# Patient Record
Sex: Female | Born: 1985 | Race: White | Hispanic: No | Marital: Married | Smoking: Former smoker
Health system: Southern US, Community
[De-identification: ages and names within clinical notes are randomized; demographics above are authoritative.]

## PROBLEM LIST (undated history)

## (undated) DIAGNOSIS — Z789 Other specified health status: Secondary | ICD-10-CM

## (undated) HISTORY — PX: APPENDECTOMY: SHX54

## (undated) HISTORY — PX: DILATION AND CURETTAGE OF UTERUS: SHX78

---

## 2013-02-23 ENCOUNTER — Emergency Department (HOSPITAL_BASED_OUTPATIENT_CLINIC_OR_DEPARTMENT_OTHER)
Admission: EM | Admit: 2013-02-23 | Discharge: 2013-02-24 | Disposition: A | Discharge status: Home / Self Care | Payer: Medicaid Other | Attending: Emergency Medicine | Admitting: Emergency Medicine

## 2013-02-23 ENCOUNTER — Encounter (HOSPITAL_BASED_OUTPATIENT_CLINIC_OR_DEPARTMENT_OTHER): Payer: Self-pay | Admitting: *Deleted

## 2013-02-23 DIAGNOSIS — F17200 Nicotine dependence, unspecified, uncomplicated: Secondary | ICD-10-CM | POA: Insufficient documentation

## 2013-02-23 DIAGNOSIS — Z3202 Encounter for pregnancy test, result negative: Secondary | ICD-10-CM | POA: Insufficient documentation

## 2013-02-23 DIAGNOSIS — R5383 Other fatigue: Secondary | ICD-10-CM | POA: Insufficient documentation

## 2013-02-23 DIAGNOSIS — M722 Plantar fascial fibromatosis: Secondary | ICD-10-CM | POA: Insufficient documentation

## 2013-02-23 DIAGNOSIS — R5381 Other malaise: Secondary | ICD-10-CM | POA: Insufficient documentation

## 2013-02-23 LAB — URINALYSIS, ROUTINE W REFLEX MICROSCOPIC
Glucose, UA: NEGATIVE mg/dL
Ketones, ur: NEGATIVE mg/dL
Leukocytes, UA: NEGATIVE
Specific Gravity, Urine: 1.03 (ref 1.005–1.030)
pH: 6.5 (ref 5.0–8.0)

## 2013-02-23 LAB — PREGNANCY, URINE: Preg Test, Ur: NEGATIVE

## 2013-02-23 NOTE — ED Provider Notes (Signed)
History    This chart was scribed for Garrett Bowring Smitty Cords, MD by Leone Payor, ED Scribe. This patient was seen in room MH01/MH01 and the patient's care was started 11:52 PM.   CSN: [id number]  Arrival date & time 02/23/13  2139   None     Chief Complaint  Patient presents with  . Foot Pain  . Fatigue     Patient is a 27 y.o. female presenting with lower extremity pain. The history is provided by the patient. No language interpreter was used.  Foot Pain This is a new problem. The current episode started more than 1 week ago. The problem occurs constantly. The problem has not changed since onset.Pertinent negatives include no chest pain, no abdominal pain, no headaches and no shortness of breath. Nothing aggravates the symptoms. Nothing relieves the symptoms. She has tried nothing for the symptoms. The treatment provided no relief.    Natalie Gilmore is a 27 y.o. female who presents to the Emergency Department complaining of ongoing, constant, gradually worsening R foot pain starting about 2 months ago. Pt denies any recent injury to the affected area. Pt had a hair splinter about 2 month ago which she was able to remove.   Pt is a current everyday smoker but denies alcohol use.  History reviewed. No pertinent past medical history.  Past Surgical History  Procedure Laterality Date  . Appendectomy    . Dilation and curettage of uterus      History reviewed. No pertinent family history.  History  Substance Use Topics  . Smoking status: Current Every Day Smoker -- 0.50 packs/day    Types: Cigarettes  . Smokeless tobacco: Not on file  . Alcohol Use: No    No OB history provided.   Review of Systems  Constitutional: Negative for fever, chills, appetite change and unexpected weight change.  HENT: Negative for sore throat, neck pain and neck stiffness.   Respiratory: Negative for shortness of breath.   Cardiovascular: Negative for chest pain.  Gastrointestinal: Negative for  abdominal pain.  Musculoskeletal: Positive for arthralgias (foot pain). Negative for myalgias, back pain and joint swelling.  Skin: Negative for rash and wound.  Neurological: Negative for headaches.  All other systems reviewed and are negative.    Allergies  Review of patient's allergies indicates no known allergies.  Home Medications  No current outpatient prescriptions on file.  BP 134/87  Pulse 83  Temp(Src) 98.2 F (36.8 C) (Oral)  Resp 20  Ht 5\' 9"  (1.753 m)  Wt 278 lb (126.1 kg)  BMI 41.03 kg/m2  SpO2 98%  LMP 01/19/2013  Physical Exam  Nursing note and vitals reviewed. Constitutional: She is oriented to person, place, and time. She appears well-developed and well-nourished.  HENT:  Head: Normocephalic and atraumatic.  Mouth/Throat: Oropharynx is clear and moist.  Eyes: EOM are normal. Pupils are equal, round, and reactive to light.  Neck: Normal range of motion. Neck supple. No tracheal deviation present.  Cardiovascular: Normal rate, regular rhythm, normal heart sounds and intact distal pulses.   3+ DP Cap refill < 2 seconds to the toe.  Pulmonary/Chest: Effort normal and breath sounds normal. No respiratory distress.  Abdominal: Soft. Bowel sounds are normal. There is no tenderness. There is no rebound and no guarding.  Musculoskeletal: Normal range of motion.  Right foot FROM and neurovascularly intact pain with palpation of the plantar fascia.    Neurological: She is alert and oriented to person, place, and time. She has normal  reflexes.  Skin: Skin is warm and dry. No rash noted. No erythema.  Psychiatric: She has a normal mood and affect.    ED Course  Procedures (including critical care time)  DIAGNOSTIC STUDIES: Oxygen Saturation is 98% on room air, normal by my interpretation.    COORDINATION OF CARE: 12:28 AM-Discussed treatment plan with pt at bedside and pt agreed to plan.    Labs Reviewed  URINALYSIS, ROUTINE W REFLEX MICROSCOPIC   PREGNANCY, URINE   No results found.   No diagnosis found.    MDM  Given history will treat for plantar fasciitis. Have instructed elevation, post op shoe, NSAIDS and changes from flats while at work to a hard soled shoe to provide better support.  Sports medicine follow up     I personally performed the services described in this documentation, which was scribed in my presence. The recorded information has been reviewed and is accurate.    Jasmine Awe, MD 02/24/13 [phone]

## 2013-02-23 NOTE — ED Notes (Signed)
Pt c/o right foot pain w/o injury also c/o generalized weakness , LMP march 17

## 2013-02-24 ENCOUNTER — Emergency Department (HOSPITAL_BASED_OUTPATIENT_CLINIC_OR_DEPARTMENT_OTHER): Payer: Medicaid Other

## 2013-02-24 MED ORDER — NAPROXEN 375 MG PO TABS: 375.0000 mg | ORAL_TABLET | Freq: Two times a day (BID) | ORAL | Status: DC

## 2013-02-24 MED ORDER — TRAMADOL HCL 50 MG PO TABS
50.0000 mg | ORAL_TABLET | Freq: Once | ORAL | Status: AC
  Administered 2013-02-24: 50 mg via ORAL
  Filled 2013-02-24: qty 1

## 2014-09-12 ENCOUNTER — Emergency Department (HOSPITAL_COMMUNITY): Payer: PRIVATE HEALTH INSURANCE

## 2014-09-12 ENCOUNTER — Emergency Department (HOSPITAL_COMMUNITY)
Admission: EM | Admit: 2014-09-12 | Discharge: 2014-09-12 | Disposition: A | Discharge status: Home / Self Care | Payer: PRIVATE HEALTH INSURANCE | Attending: Emergency Medicine | Admitting: Emergency Medicine

## 2014-09-12 ENCOUNTER — Encounter (HOSPITAL_COMMUNITY): Payer: Self-pay | Admitting: Emergency Medicine

## 2014-09-12 DIAGNOSIS — R1030 Lower abdominal pain, unspecified: Secondary | ICD-10-CM

## 2014-09-12 DIAGNOSIS — A64 Unspecified sexually transmitted disease: Secondary | ICD-10-CM | POA: Insufficient documentation

## 2014-09-12 DIAGNOSIS — R1032 Left lower quadrant pain: Secondary | ICD-10-CM | POA: Insufficient documentation

## 2014-09-12 DIAGNOSIS — M549 Dorsalgia, unspecified: Secondary | ICD-10-CM | POA: Insufficient documentation

## 2014-09-12 DIAGNOSIS — R1013 Epigastric pain: Secondary | ICD-10-CM | POA: Insufficient documentation

## 2014-09-12 DIAGNOSIS — Z720 Tobacco use: Secondary | ICD-10-CM | POA: Insufficient documentation

## 2014-09-12 DIAGNOSIS — R1031 Right lower quadrant pain: Secondary | ICD-10-CM | POA: Insufficient documentation

## 2014-09-12 DIAGNOSIS — Z791 Long term (current) use of non-steroidal anti-inflammatories (NSAID): Secondary | ICD-10-CM | POA: Insufficient documentation

## 2014-09-12 DIAGNOSIS — K5900 Constipation, unspecified: Secondary | ICD-10-CM | POA: Insufficient documentation

## 2014-09-12 DIAGNOSIS — Z9089 Acquired absence of other organs: Secondary | ICD-10-CM | POA: Insufficient documentation

## 2014-09-12 DIAGNOSIS — Z3202 Encounter for pregnancy test, result negative: Secondary | ICD-10-CM | POA: Insufficient documentation

## 2014-09-12 LAB — CBC WITH DIFFERENTIAL/PLATELET
Basophils Absolute: 0 10*3/uL (ref 0.0–0.1)
Basophils Relative: 0 % (ref 0–1)
Eosinophils Absolute: 0.1 10*3/uL (ref 0.0–0.7)
Eosinophils Relative: 2 % (ref 0–5)
HCT: 44 % (ref 36.0–46.0)
Hemoglobin: 14.9 g/dL (ref 12.0–15.0)
Lymphocytes Relative: 32 % (ref 12–46)
Lymphs Abs: 2.6 10*3/uL (ref 0.7–4.0)
MCH: 30.5 pg (ref 26.0–34.0)
MCHC: 33.9 g/dL (ref 30.0–36.0)
MCV: 90.2 fL (ref 78.0–100.0)
Monocytes Absolute: 0.6 10*3/uL (ref 0.1–1.0)
Monocytes Relative: 8 % (ref 3–12)
Neutro Abs: 4.7 10*3/uL (ref 1.7–7.7)
Neutrophils Relative %: 58 % (ref 43–77)
Platelets: 230 10*3/uL (ref 150–400)
RBC: 4.88 MIL/uL (ref 3.87–5.11)
RDW: 12.4 % (ref 11.5–15.5)
WBC: 8.1 10*3/uL (ref 4.0–10.5)

## 2014-09-12 LAB — COMPREHENSIVE METABOLIC PANEL
ALT: 24 U/L (ref 0–35)
AST: 23 U/L (ref 0–37)
Albumin: 3.6 g/dL (ref 3.5–5.2)
Alkaline Phosphatase: 69 U/L (ref 39–117)
Anion gap: 14 (ref 5–15)
BUN: 12 mg/dL (ref 6–23)
CALCIUM: 9.1 mg/dL (ref 8.4–10.5)
CO2: 21 meq/L (ref 19–32)
CREATININE: 0.59 mg/dL (ref 0.50–1.10)
Chloride: 106 mEq/L (ref 96–112)
GFR calc Af Amer: >90 mL/min (ref >90)
Glucose, Bld: 92 mg/dL (ref 70–99)
Potassium: 4.6 mEq/L (ref 3.7–5.3)
Sodium: 141 mEq/L (ref 137–147)
TOTAL PROTEIN: 7.9 g/dL (ref 6.0–8.3)
Total Bilirubin: 0.2 mg/dL — ABNORMAL LOW (ref 0.3–1.2)

## 2014-09-12 LAB — URINALYSIS, ROUTINE W REFLEX MICROSCOPIC
BILIRUBIN URINE: NEGATIVE
Glucose, UA: NEGATIVE mg/dL
Hgb urine dipstick: NEGATIVE
KETONES UR: NEGATIVE mg/dL
Leukocytes, UA: NEGATIVE
NITRITE: NEGATIVE
Protein, ur: NEGATIVE mg/dL
SPECIFIC GRAVITY, URINE: 1.028 (ref 1.005–1.030)
UROBILINOGEN UA: 0.2 mg/dL (ref 0.0–1.0)
pH: 7 (ref 5.0–8.0)

## 2014-09-12 LAB — LIPASE, BLOOD: Lipase: 22 U/L (ref 11–59)

## 2014-09-12 LAB — WET PREP, GENITAL
Clue Cells Wet Prep HPF POC: NONE SEEN
Trich, Wet Prep: NONE SEEN
Yeast Wet Prep HPF POC: NONE SEEN

## 2014-09-12 LAB — POC URINE PREG, ED: Preg Test, Ur: NEGATIVE

## 2014-09-12 MED ORDER — OMEPRAZOLE 20 MG PO CPDR: 20.0000 mg | DELAYED_RELEASE_CAPSULE | Freq: Every day | ORAL | Status: DC

## 2014-09-12 MED ORDER — SUCRALFATE 1 G PO TABS: 1.0000 g | ORAL_TABLET | Freq: Four times a day (QID) | ORAL | Status: DC

## 2014-09-12 MED ORDER — CEFTRIAXONE SODIUM 250 MG IJ SOLR
250.0000 mg | Freq: Once | INTRAMUSCULAR | Status: AC
  Administered 2014-09-12: 250 mg via INTRAMUSCULAR
  Filled 2014-09-12: qty 250

## 2014-09-12 MED ORDER — AZITHROMYCIN 250 MG PO TABS
1000.0000 mg | ORAL_TABLET | Freq: Once | ORAL | Status: AC
  Administered 2014-09-12: 1000 mg via ORAL
  Filled 2014-09-12: qty 4

## 2014-09-12 MED ORDER — IOHEXOL 300 MG/ML  SOLN
100.0000 mL | Freq: Once | INTRAMUSCULAR | Status: AC | PRN
  Administered 2014-09-12: 100 mL via INTRAVENOUS

## 2014-09-12 MED ORDER — PANTOPRAZOLE SODIUM 40 MG PO TBEC
40.0000 mg | DELAYED_RELEASE_TABLET | Freq: Once | ORAL | Status: AC
  Administered 2014-09-12: 40 mg via ORAL
  Filled 2014-09-12: qty 1

## 2014-09-12 MED ORDER — STERILE WATER FOR INJECTION IJ SOLN
INTRAMUSCULAR | Status: AC
  Administered 2014-09-12: 10 mL
  Filled 2014-09-12: qty 10

## 2014-09-12 MED ORDER — IOHEXOL 300 MG/ML  SOLN
50.0000 mL | Freq: Once | INTRAMUSCULAR | Status: AC | PRN
  Administered 2014-09-12: 50 mL via ORAL

## 2014-09-12 MED ORDER — GI COCKTAIL ~~LOC~~
30.0000 mL | Freq: Once | ORAL | Status: AC
  Administered 2014-09-12: 30 mL via ORAL
  Filled 2014-09-12: qty 30

## 2014-09-12 NOTE — ED Provider Notes (Signed)
CSN: [id number]     Arrival date & time 09/12/14  1424 History   First MD Initiated Contact with Patient 09/12/14 1459     Chief Complaint  Patient presents with  . Abdominal Pain  . Flank Pain  . Back Pain     (Consider location/radiation/quality/duration/timing/severity/associated sxs/prior Treatment) Patient is a 28 y.o. female presenting with abdominal pain, flank pain, and back pain. The history is provided by the patient.  Abdominal Pain Pain location:  Epigastric, suprapubic, RLQ and LLQ Pain quality: aching (lower abdomen) and sharp (and tightness in upper abdomen)   Pain radiates to:  Back Pain severity:  Moderate Onset quality:  Gradual Duration:  3 days (upper abdominal pain for 3 days, lower abdominal pain for 2 weeks) Timing:  Constant Progression:  Unchanged Chronicity:  New Context: eating   Context: not alcohol use and not diet changes   Relieved by:  Nothing Worsened by:  Eating Associated symptoms: constipation   Associated symptoms: no belching, no chills, no cough, no diarrhea, no dysuria, no fever, no shortness of breath and no vomiting   Risk factors: has not had multiple surgeries   Flank Pain Associated symptoms include abdominal pain. Pertinent negatives include no shortness of breath.  Back Pain Associated symptoms: abdominal pain   Associated symptoms: no dysuria and no fever     History reviewed. No pertinent past medical history. Past Surgical History  Procedure Laterality Date  . Appendectomy    . Dilation and curettage of uterus     History reviewed. No pertinent family history. History  Substance Use Topics  . Smoking status: Current Every Day Smoker -- 0.50 packs/day    Types: Cigarettes  . Smokeless tobacco: Not on file  . Alcohol Use: No   OB History    No data available     Review of Systems  Constitutional: Negative for fever and chills.  Respiratory: Negative for cough and shortness of breath.   Gastrointestinal: Positive  for abdominal pain and constipation. Negative for vomiting and diarrhea.  Genitourinary: Positive for flank pain. Negative for dysuria.  Musculoskeletal: Positive for back pain.  All other systems reviewed and are negative.     Allergies  Review of patient's allergies indicates no known allergies.  Home Medications   Prior to Admission medications   Medication Sig Start Date End Date Taking? Authorizing Provider  bismuth subsalicylate (PEPTO BISMOL) 262 MG chewable tablet Chew 524 mg by mouth as needed for indigestion or diarrhea or loose stools.   Yes Historical Provider, MD  naproxen sodium (ANAPROX) 220 MG tablet Take 220 mg by mouth 2 (two) times daily with a meal.   Yes Historical Provider, MD  naproxen (NAPROSYN) 375 MG tablet Take 1 tablet (375 mg total) by mouth 2 (two) times daily. 02/24/13   April K Palumbo-Rasch, MD   BP 134/69 mmHg  Pulse 82  Temp(Src) 98 F (36.7 C) (Oral)  Resp 20  SpO2 97% Physical Exam  Constitutional: She is oriented to person, place, and time. She appears well-developed and well-nourished. No distress.  HENT:  Head: Normocephalic and atraumatic.  Mouth/Throat: Oropharynx is clear and moist.  Eyes: EOM are normal. Pupils are equal, round, and reactive to light.  Neck: Normal range of motion. Neck supple.  Cardiovascular: Normal rate and regular rhythm.  Exam reveals no friction rub.   No murmur heard. Pulmonary/Chest: Effort normal and breath sounds normal. No respiratory distress. She has no wheezes. She has no rales.  Abdominal: Soft. She  exhibits no distension. There is tenderness (mild, epigastric and bilateral lower quadrants) in the right lower quadrant, epigastric area, suprapubic area and left lower quadrant. There is no rebound.  Genitourinary: Cervix exhibits discharge (yellow). Cervix exhibits no motion tenderness. Right adnexum displays no mass and no tenderness. Left adnexum displays tenderness (very mild). Left adnexum displays no  mass and no fullness.  Musculoskeletal: Normal range of motion. She exhibits no edema.  Neurological: She is alert and oriented to person, place, and time.  Skin: No rash noted. She is not diaphoretic.  Nursing note and vitals reviewed.   ED Course  Procedures (including critical care time) Labs Review Labs Reviewed  GC/CHLAMYDIA PROBE AMP  WET PREP, GENITAL  CBC WITH DIFFERENTIAL  URINALYSIS, ROUTINE W REFLEX MICROSCOPIC  COMPREHENSIVE METABOLIC PANEL  LIPASE, BLOOD  POC URINE PREG, ED    Imaging Review Ct Abdomen Pelvis W Contrast  09/12/2014   CLINICAL DATA:  Upper gastric abd pain for 2 weeks, nausea, took pepto and said that she began to have dark stools. Pain worse after eating, food makes it worse. No relief with any meds. Denies any fever. Urine has also been dark and foul odor for 2 months  EXAM: CT ABDOMEN AND PELVIS WITH CONTRAST  TECHNIQUE: Multidetector CT imaging of the abdomen and pelvis was performed using the standard protocol following bolus administration of intravenous contrast.  CONTRAST:  50mL OMNIPAQUE IOHEXOL 300 MG/ML SOLN, 100mL OMNIPAQUE IOHEXOL 300 MG/ML SOLN  COMPARISON:  None.  FINDINGS: Clear lung bases.  Heart is normal size.  Diffuse fatty infiltration of the liver. No liver mass or focal lesion.  Normal spleen, gallbladder, pancreas, adrenal glands, kidneys, ureters, bladder.  Uterus and adnexa are unremarkable.  No adenopathy.  No abnormal fluid collections.  Normal colon and small bowel.  Appendix has been removed.  No bony abnormality.  IMPRESSION: 1. No acute findings. No findings to explain patient's abdominal pain or dark stools. 2. Hepatic steatosis.  No other abnormalities. 3. Status post appendectomy.   Electronically Signed   By: Amie Portlandavid  Ormond M.D.   On: 09/12/2014 16:55     EKG Interpretation None      MDM   Final diagnoses:  Lower abdominal pain  Epigastric pain  STD (female)    28 year old female presents with abdominal pain. 3 days  of sharp epigastric pain radiating to her back. Is worse with eating. No relief with Pepto. She is concerned she might have an ulcer. She is a smoker. Associated nausea but no vomiting or diarrhea. She's also having some lower abdominal pain for the past 2 weeks. She describes aching. Denies any vaginal bleeding or discharge. Denies any dysuria, but does have foul odor to her urine. On exam she has mild epigastric pain and mild diffuse lower abdominal pain. No rebound or guarding. Pelvic exam is with very mild tenderness. She does have very scant yellow discharge from her cervix, but cervical appearance is otherwise normal. We'll CT her abdomen multiple sites of abdominal pain. Epigastric pain sounds like ulcer-like pain.  Scan negative. Likely gastric ulcers for her epigastric pain. Will treat with carafate, PPI, will give GI referral.   Had white cells on her wet prep. Patient is concerned about STDs. I discussed treatment for PID, she doesn't have insurance so can't afford doxycycline. I discussed will treat with Azithro/Rocephin and have her f/u with OB if it doesn't improve.   Elwin MochaBlair Via Rosado, MD 09/12/14 [phone]

## 2014-09-12 NOTE — ED Notes (Signed)
Pt escorted to discharge window. Pt verbalized understanding discharge instructions. In no acute distress.  

## 2014-09-12 NOTE — Discharge Instructions (Signed)
Abdominal Pain, Women °Abdominal (stomach, pelvic, or belly) pain can be caused by many things. It is important to tell your doctor: °· The location of the pain. °· Does it come and go or is it present all the time? °· Are there things that start the pain (eating certain foods, exercise)? °· Are there other symptoms associated with the pain (fever, nausea, vomiting, diarrhea)? °All of this is helpful to know when trying to find the cause of the pain. °CAUSES  °· Stomach: virus or bacteria infection, or ulcer. °· Intestine: appendicitis (inflamed appendix), regional ileitis (Crohn's disease), ulcerative colitis (inflamed colon), irritable bowel syndrome, diverticulitis (inflamed diverticulum of the colon), or cancer of the stomach or intestine. °· Gallbladder disease or stones in the gallbladder. °· Kidney disease, kidney stones, or infection. °· Pancreas infection or cancer. °· Fibromyalgia (pain disorder). °· Diseases of the female organs: °· Uterus: fibroid (non-cancerous) tumors or infection. °· Fallopian tubes: infection or tubal pregnancy. °· Ovary: cysts or tumors. °· Pelvic adhesions (scar tissue). °· Endometriosis (uterus lining tissue growing in the pelvis and on the pelvic organs). °· Pelvic congestion syndrome (female organs filling up with blood just before the menstrual period). °· Pain with the menstrual period. °· Pain with ovulation (producing an egg). °· Pain with an IUD (intrauterine device, birth control) in the uterus. °· Cancer of the female organs. °· Functional pain (pain not caused by a disease, may improve without treatment). °· Psychological pain. °· Depression. °DIAGNOSIS  °Your doctor will decide the seriousness of your pain by doing an examination. °· Blood tests. °· X-rays. °· Ultrasound. °· CT scan (computed tomography, special type of X-ray). °· MRI (magnetic resonance imaging). °· Cultures, for infection. °· Barium enema (dye inserted in the large intestine, to better view it with  X-rays). °· Colonoscopy (looking in intestine with a lighted tube). °· Laparoscopy (minor surgery, looking in abdomen with a lighted tube). °· Major abdominal exploratory surgery (looking in abdomen with a large incision). °TREATMENT  °The treatment will depend on the cause of the pain.  °· Many cases can be observed and treated at home. °· Over-the-counter medicines recommended by your caregiver. °· Prescription medicine. °· Antibiotics, for infection. °· Birth control pills, for painful periods or for ovulation pain. °· Hormone treatment, for endometriosis. °· Nerve blocking injections. °· Physical therapy. °· Antidepressants. °· Counseling with a psychologist or psychiatrist. °· Minor or major surgery. °HOME CARE INSTRUCTIONS  °· Do not take laxatives, unless directed by your caregiver. °· Take over-the-counter pain medicine only if ordered by your caregiver. Do not take aspirin because it can cause an upset stomach or bleeding. °· Try a clear liquid diet (broth or water) as ordered by your caregiver. Slowly move to a bland diet, as tolerated, if the pain is related to the stomach or intestine. °· Have a thermometer and take your temperature several times a day, and record it. °· Bed rest and sleep, if it helps the pain. °· Avoid sexual intercourse, if it causes pain. °· Avoid stressful situations. °· Keep your follow-up appointments and tests, as your caregiver orders. °· If the pain does not go away with medicine or surgery, you may try: °· Acupuncture. °· Relaxation exercises (yoga, meditation). °· Group therapy. °· Counseling. °SEEK MEDICAL CARE IF:  °· You notice certain foods cause stomach pain. °· Your home care treatment is not helping your pain. °· You need stronger pain medicine. °· You want your IUD removed. °· You feel faint or   lightheaded.  You develop nausea and vomiting.  You develop a rash.  You are having side effects or an allergy to your medicine. SEEK IMMEDIATE MEDICAL CARE IF:   Your  pain does not go away or gets worse.  You have a fever.  Your pain is felt only in portions of the abdomen. The right side could possibly be appendicitis. The left lower portion of the abdomen could be colitis or diverticulitis.  You are passing blood in your stools (bright red or black tarry stools, with or without vomiting).  You have blood in your urine.  You develop chills, with or without a fever.  You pass out. MAKE SURE YOU:   Understand these instructions.  Will watch your condition.  Will get help right away if you are not doing well or get worse. Document Released: 08/19/2007 Document Revised: 03/08/2014 Document Reviewed: 09/08/2009 [facility] Patient Information 2015 [place], [place]. This information is not intended to replace advice given to you by your health care provider. Make sure you discuss any questions you have with your health care provider.  Sexually Transmitted Disease A sexually transmitted disease (STD) is a disease or infection that may be passed (transmitted) from person to person, usually during sexual activity. This may happen by way of saliva, semen, blood, vaginal mucus, or urine. Common STDs include:   Gonorrhea.   Chlamydia.   Syphilis.   HIV and AIDS.   Genital herpes.   Hepatitis B and C.   Trichomonas.   Human papillomavirus (HPV).   Pubic lice.   Scabies.  Mites.  Bacterial vaginosis. WHAT ARE CAUSES OF STDs? An STD may be caused by bacteria, a virus, or parasites. STDs are often transmitted during sexual activity if one person is infected. However, they may also be transmitted through nonsexual means. STDs may be transmitted after:   Sexual intercourse with an infected person.   Sharing sex toys with an infected person.   Sharing needles with an infected person or using unclean piercing or tattoo needles.  Having intimate contact with the genitals, mouth, or rectal areas of an infected person.   Exposure to  infected fluids during birth. WHAT ARE THE SIGNS AND SYMPTOMS OF STDs? Different STDs have different symptoms. Some people may not have any symptoms. If symptoms are present, they may include:   Painful or bloody urination.   Pain in the pelvis, abdomen, vagina, anus, throat, or eyes.   A skin rash, itching, or irritation.  Growths, ulcerations, blisters, or sores in the genital and anal areas.  Abnormal vaginal discharge with or without bad odor.   Penile discharge in men.   Fever.   Pain or bleeding during sexual intercourse.   Swollen glands in the groin area.   Yellow skin and eyes (jaundice). This is seen with hepatitis.   Swollen testicles.  Infertility.  Sores and blisters in the mouth. HOW ARE STDs DIAGNOSED? To make a diagnosis, your health care provider may:   Take a medical history.   Perform a physical exam.   Take a sample of any discharge to examine.  Swab the throat, cervix, opening to the penis, rectum, or vagina for testing.  Test a sample of your first morning urine.   Perform blood tests.   Perform a Pap test, if this applies.   Perform a colposcopy.   Perform a laparoscopy.  HOW ARE STDs TREATED? Treatment depends on the STD. Some STDs may be treated but not cured.   Chlamydia, gonorrhea, trichomonas,  and syphilis can be cured with antibiotic medicine.   Genital herpes, hepatitis, and HIV can be treated, but not cured, with prescribed medicines. The medicines lessen symptoms.   Genital warts from HPV can be treated with medicine or by freezing, burning (electrocautery), or surgery. Warts may come back.   HPV cannot be cured with medicine or surgery. However, abnormal areas may be removed from the cervix, vagina, or vulva.   If your diagnosis is confirmed, your recent sexual partners need treatment. This is true even if they are symptom-free or have a negative culture or evaluation. They should not have sex until their  health care providers say it is okay. HOW CAN I REDUCE MY RISK OF GETTING AN STD? Take these steps to reduce your risk of getting an STD:  Use latex condoms, dental dams, and water-soluble lubricants during sexual activity. Do not use petroleum jelly or oils.  Avoid having multiple sex partners.  Do not have sex with someone who has other sex partners.  Do not have sex with anyone you do not know or who is at high risk for an STD.  Avoid risky sex practices that can break your skin.  Do not have sex if you have open sores on your mouth or skin.  Avoid drinking too much alcohol or taking illegal drugs. Alcohol and drugs can affect your judgment and put you in a vulnerable position.  Avoid engaging in oral and anal sex acts.  Get vaccinated for HPV and hepatitis. If you have not received these vaccines in the past, talk to your health care provider about whether one or both might be right for you.   If you are at risk of being infected with HIV, it is recommended that you take a prescription medicine daily to prevent HIV infection. This is called pre-exposure prophylaxis (PrEP). You are considered at risk if:  You are a man who has sex with other men (MSM).  You are a heterosexual man or woman and are sexually active with more than one partner.  You take drugs by injection.  You are sexually active with a partner who has HIV.  Talk with your health care provider about whether you are at high risk of being infected with HIV. If you choose to begin PrEP, you should first be tested for HIV. You should then be tested every 3 months for as long as you are taking PrEP.  WHAT SHOULD I DO IF I THINK I HAVE AN STD?  See your health care provider.   Tell your sexual partner(s). They should be tested and treated for any STDs.  Do not have sex until your health care provider says it is okay. WHEN SHOULD I GET IMMEDIATE MEDICAL CARE? Contact your health care provider right away if:    You have severe abdominal pain.  You are a man and notice swelling or pain in your testicles.  You are a woman and notice swelling or pain in your vagina. Document Released: 01/12/2003 Document Revised: 10/27/2013 Document Reviewed: 05/12/2013 [facility] Patient Information 2015 [place]. This information is not intended to replace advice given to you by your health care provider. Make sure you discuss any questions you have with your health care provider.

## 2014-09-12 NOTE — ED Notes (Signed)
Upper gastric abd pain for 2 weeks, nausea, took pepto and said that she began to have dark stools. Pain worse after eating food makes it worse. No relief with any meds. Denies any fever. Urine has also been dark and foul odor for 2 months pt does not have insurance so has not been able to have this looked at. Denies any urinary frequency or buring or vaginal discharge.

## 2014-09-13 LAB — GC/CHLAMYDIA PROBE AMP
CT Probe RNA: NEGATIVE
GC PROBE AMP APTIMA: NEGATIVE

## 2014-09-14 NOTE — ED Notes (Signed)
Charge left message informing pt she left her d/c papers.

## 2014-09-14 NOTE — ED Notes (Signed)
Pt left and did not take discharge papers, charge got phone number from chart. Will call pt to inform her.

## 2015-11-15 ENCOUNTER — Encounter (HOSPITAL_BASED_OUTPATIENT_CLINIC_OR_DEPARTMENT_OTHER): Payer: Self-pay

## 2015-11-15 ENCOUNTER — Emergency Department (HOSPITAL_BASED_OUTPATIENT_CLINIC_OR_DEPARTMENT_OTHER): Payer: PRIVATE HEALTH INSURANCE

## 2015-11-15 ENCOUNTER — Emergency Department (HOSPITAL_BASED_OUTPATIENT_CLINIC_OR_DEPARTMENT_OTHER)
Admission: EM | Admit: 2015-11-15 | Discharge: 2015-11-15 | Disposition: A | Discharge status: Home / Self Care | Payer: PRIVATE HEALTH INSURANCE | Attending: Emergency Medicine | Admitting: Emergency Medicine

## 2015-11-15 DIAGNOSIS — R109 Unspecified abdominal pain: Secondary | ICD-10-CM

## 2015-11-15 DIAGNOSIS — K8020 Calculus of gallbladder without cholecystitis without obstruction: Secondary | ICD-10-CM | POA: Insufficient documentation

## 2015-11-15 DIAGNOSIS — Z9889 Other specified postprocedural states: Secondary | ICD-10-CM | POA: Insufficient documentation

## 2015-11-15 DIAGNOSIS — F17210 Nicotine dependence, cigarettes, uncomplicated: Secondary | ICD-10-CM | POA: Insufficient documentation

## 2015-11-15 DIAGNOSIS — R1013 Epigastric pain: Secondary | ICD-10-CM

## 2015-11-15 DIAGNOSIS — Z9049 Acquired absence of other specified parts of digestive tract: Secondary | ICD-10-CM | POA: Insufficient documentation

## 2015-11-15 DIAGNOSIS — Z3202 Encounter for pregnancy test, result negative: Secondary | ICD-10-CM | POA: Insufficient documentation

## 2015-11-15 LAB — CBC WITH DIFFERENTIAL/PLATELET
BASOS ABS: 0 10*3/uL (ref 0.0–0.1)
BASOS PCT: 0 %
Eosinophils Absolute: 0.1 10*3/uL (ref 0.0–0.7)
Eosinophils Relative: 1 %
HEMATOCRIT: 43.5 % (ref 36.0–46.0)
Hemoglobin: 14.4 g/dL (ref 12.0–15.0)
Lymphocytes Relative: 8 %
Lymphs Abs: 0.8 10*3/uL (ref 0.7–4.0)
MCH: 29.9 pg (ref 26.0–34.0)
MCHC: 33.1 g/dL (ref 30.0–36.0)
MCV: 90.2 fL (ref 78.0–100.0)
MONO ABS: 0.7 10*3/uL (ref 0.1–1.0)
Monocytes Relative: 7 %
NEUTROS ABS: 8.8 10*3/uL — AB (ref 1.7–7.7)
Neutrophils Relative %: 84 %
PLATELETS: 191 10*3/uL (ref 150–400)
RBC: 4.82 MIL/uL (ref 3.87–5.11)
RDW: 12.8 % (ref 11.5–15.5)
WBC: 10.4 10*3/uL (ref 4.0–10.5)

## 2015-11-15 LAB — URINALYSIS, ROUTINE W REFLEX MICROSCOPIC
BILIRUBIN URINE: NEGATIVE
Glucose, UA: NEGATIVE mg/dL
Ketones, ur: NEGATIVE mg/dL
Leukocytes, UA: NEGATIVE
Nitrite: NEGATIVE
PH: 6 (ref 5.0–8.0)
Protein, ur: NEGATIVE mg/dL
SPECIFIC GRAVITY, URINE: 1.027 (ref 1.005–1.030)

## 2015-11-15 LAB — COMPREHENSIVE METABOLIC PANEL
ALT: 15 U/L (ref 14–54)
ANION GAP: 6 (ref 5–15)
AST: 19 U/L (ref 15–41)
Albumin: 3.8 g/dL (ref 3.5–5.0)
Alkaline Phosphatase: 58 U/L (ref 38–126)
BILIRUBIN TOTAL: 0.7 mg/dL (ref 0.3–1.2)
BUN: 13 mg/dL (ref 6–20)
CO2: 22 mmol/L (ref 22–32)
Calcium: 8.5 mg/dL — ABNORMAL LOW (ref 8.9–10.3)
Chloride: 105 mmol/L (ref 101–111)
Creatinine, Ser: 0.69 mg/dL (ref 0.44–1.00)
GFR calc Af Amer: >60 mL/min (ref >60)
Glucose, Bld: 96 mg/dL (ref 65–99)
POTASSIUM: 4 mmol/L (ref 3.5–5.1)
Sodium: 133 mmol/L — ABNORMAL LOW (ref 135–145)
TOTAL PROTEIN: 7.5 g/dL (ref 6.5–8.1)

## 2015-11-15 LAB — LIPASE, BLOOD: LIPASE: 18 U/L (ref 11–51)

## 2015-11-15 LAB — URINE MICROSCOPIC-ADD ON

## 2015-11-15 LAB — PREGNANCY, URINE: Preg Test, Ur: NEGATIVE

## 2015-11-15 MED ORDER — DICYCLOMINE HCL 20 MG PO TABS: 20.0000 mg | ORAL_TABLET | Freq: Three times a day (TID) | ORAL | Status: DC

## 2015-11-15 MED ORDER — OMEPRAZOLE 20 MG PO CPDR: 20.0000 mg | DELAYED_RELEASE_CAPSULE | Freq: Every day | ORAL | Status: DC

## 2015-11-15 MED ORDER — HYOSCYAMINE SULFATE 0.125 MG SL SUBL
0.1250 mg | SUBLINGUAL_TABLET | Freq: Once | SUBLINGUAL | Status: AC
  Administered 2015-11-15: 0.125 mg via SUBLINGUAL
  Filled 2015-11-15: qty 1

## 2015-11-15 MED ORDER — HYOSCYAMINE SULFATE 0.125 MG SL SUBL
0.1250 mg | SUBLINGUAL_TABLET | Freq: Once | SUBLINGUAL | Status: AC
  Administered 2015-11-15: 0.125 mg via ORAL
  Filled 2015-11-15: qty 1

## 2015-11-15 MED ORDER — SODIUM CHLORIDE 0.9 % IV BOLUS (SEPSIS)
1000.0000 mL | Freq: Once | INTRAVENOUS | Status: AC
  Administered 2015-11-15: 1000 mL via INTRAVENOUS

## 2015-11-15 NOTE — ED Notes (Signed)
Patient stable and ambulatory.  Patient verbalizes understanding of discharge medications, instructions and follow-up. 

## 2015-11-15 NOTE — ED Notes (Signed)
abd pain x 2 months-worse after eating-NAD-steady gait

## 2015-11-15 NOTE — ED Notes (Signed)
MD notified.  MD would like test to be resulted before treating headache to ensure no contraindications.

## 2015-11-15 NOTE — ED Provider Notes (Signed)
CSN: [id number]     Arrival date & time 11/15/15  1522 History   First MD Initiated Contact with Patient 11/15/15 1548     Chief Complaint  Patient presents with  . Abdominal Pain     Patient is a 30 y.o. female presenting with abdominal pain. The history is provided by the patient. No language interpreter was used.  Abdominal Pain  Natalie Gilmore is a 30 y.o. female who presents to the Emergency Department complaining of abdominal pain.  She reports 2 months of epigastric abdominal pain.  She has a mild constant pain that is significantly worse with large meals.  At 430 this morning she had a severe recurrence of her pain.  She has associated nausea, diaphoresis, diarrhea.  No fevers, chest pain, dysuria.  She has a hx/o appendectomy.  No prior eval for these symptoms.  Sxs are moderate, waxing and waning, worsening.    History reviewed. No pertinent past medical history. Past Surgical History  Procedure Laterality Date  . Appendectomy    . Dilation and curettage of uterus     No family history on file. Social History  Substance Use Topics  . Smoking status: Current Every Day Smoker -- 0.50 packs/day    Types: Cigarettes  . Smokeless tobacco: None  . Alcohol Use: Yes     Comment: occ   OB History    No data available     Review of Systems  Gastrointestinal: Positive for abdominal pain.  All other systems reviewed and are negative.     Allergies  Review of patient's allergies indicates no known allergies.  Home Medications   Prior to Admission medications   Medication Sig Start Date End Date Taking? Authorizing Provider  dicyclomine (BENTYL) 20 MG tablet Take 1 tablet (20 mg total) by mouth 3 (three) times daily before meals. 11/15/15   Tilden Fossa, MD  omeprazole (PRILOSEC) 20 MG capsule Take 1 capsule (20 mg total) by mouth daily. 11/15/15   Tilden Fossa, MD   BP 124/73 mmHg  Pulse 96  Temp(Src) 98.5 F (36.9 C) (Oral)  Resp 20  Ht 5\' 9"  (1.753 m)  Wt 276  lb (125.193 kg)  BMI 40.74 kg/m2  SpO2 100%  LMP 10/11/2015 Physical Exam  Constitutional: She is oriented to person, place, and time. She appears well-developed and well-nourished.  HENT:  Head: Normocephalic and atraumatic.  Cardiovascular: Normal rate and regular rhythm.   No murmur heard. Pulmonary/Chest: Effort normal and breath sounds normal. No respiratory distress.  Abdominal: Soft. There is no rebound and no guarding.  Obese, moderate epigastric tenderness without guarding or rebound.    Musculoskeletal: She exhibits no edema or tenderness.  Neurological: She is alert and oriented to person, place, and time.  Skin: Skin is warm and dry.  Psychiatric: She has a normal mood and affect. Her behavior is normal.  Nursing note and vitals reviewed.   ED Course  Procedures (including critical care time) Labs Review Labs Reviewed  URINALYSIS, ROUTINE W REFLEX MICROSCOPIC (NOT AT [facility]) - Abnormal; Notable for the following:    Color, Urine AMBER (*)    Hgb urine dipstick MODERATE (*)    All other components within normal limits  URINE MICROSCOPIC-ADD ON - Abnormal; Notable for the following:    Squamous Epithelial / LPF 0-5 (*)    Bacteria, UA RARE (*)    All other components within normal limits  COMPREHENSIVE METABOLIC PANEL - Abnormal; Notable for the following:    Sodium 133 (*)  Calcium 8.5 (*)    All other components within normal limits  CBC WITH DIFFERENTIAL/PLATELET - Abnormal; Notable for the following:    Neutro Abs 8.8 (*)    All other components within normal limits  PREGNANCY, URINE  LIPASE, BLOOD    Imaging Review [place] Abdomen Limited Ruq  11/15/2015  CLINICAL DATA:  Gastroparesis. EXAM: US ABDOMEN LIMITED - RIGHT UPPER QUADRANT COMPARISON:  CT scan of September 12, 2014. FINDINGS: Gallbladder: Cholelithiasis is noted without gallbladder wall thickening or pericholecystic fluid. No sonographic Murphy's sign is noted. Common bile duct: Diameter: 2 mm which is  within normal limits. Liver: No focal lesion identified. Increased echogenicity is noted consistent with fatty infiltration. Right kidney measures 12 cm in length and is within normal limits. Visualized portion of abdominal aorta appears normal. Visualized portion of pancreas appears normal. IMPRESSION: Cholelithiasis is noted without evidence of cholecystitis. Fatty infiltration of the liver is noted. Electronically Signed   By: Lupita RaiderJames  Green Jr, M.D.   On: 11/15/2015 17:54   I have personally reviewed and evaluated these images and lab results as part of my medical decision-making.   EKG Interpretation None      MDM   Final diagnoses:  Abdominal pain  Epigastric pain  Calculus of gallbladder without cholecystitis without obstruction    Patient here for evaluation of epigastric abdominal pain, worse with meals. On initial examination she is moderately tender, this improved after labs and in the department. Ultrasound with gallstones without any evidence of cholecystitis. Discussed with patient concern for biliary colic versus gastritis. Will treat for both with outpatient medicine and surgery follow-up. Home care and return precautions were discussed.    Tilden FossaElizabeth Ellyn Rubiano, MD 11/15/15 [phone]

## 2015-11-15 NOTE — ED Notes (Signed)
Patient transferred to US

## 2015-11-15 NOTE — ED Notes (Signed)
Patient requesting some medication for her headache.

## 2015-11-15 NOTE — Discharge Instructions (Signed)
Biliary Colic °Biliary colic is a pain in the upper abdomen. The pain: °· Is usually felt on the right side of the abdomen, but it may also be felt in the center of the abdomen, just below the breastbone (sternum). °· May spread back toward the right shoulder blade. °· May be steady or irregular. °· May be accompanied by nausea and vomiting. °Most of the time, the pain goes away in 1-5 hours. After the most intense pain passes, the abdomen may continue to ache mildly for about 24 hours. °Biliary colic is caused by a blockage in the bile duct. The bile duct is a pathway that carries bile--a liquid that helps to digest fats--from the gallbladder to the small intestine. Biliary colic usually occurs after eating, when the digestive system demands bile. The pain develops when muscle cells contract forcefully to try to move the blockage so that bile can get by. °HOME CARE INSTRUCTIONS °· Take medicines only as directed by your health care provider. °· Drink enough fluid to keep your urine clear or pale yellow. °· Avoid fatty, greasy, and fried foods. These kinds of foods increase your body's demand for bile. °· Avoid any foods that make your pain worse. °· Avoid overeating. °· Avoid having a large meal after fasting. °SEEK MEDICAL CARE IF: °· You develop a fever. °· Your pain gets worse. °· You vomit. °· You develop nausea that prevents you from eating and drinking. °SEEK IMMEDIATE MEDICAL CARE IF: °· You suddenly develop a fever and shaking chills. °· You develop a yellowish discoloration (jaundice) of: °¨ Skin. °¨ Whites of the eyes. °¨ Mucous membranes. °· You have continuous or severe pain that is not relieved with medicines. °· You have nausea and vomiting that is not relieved with medicines. °· You develop dizziness or you faint. °  °This information is not intended to replace advice given to you by your health care provider. Make sure you discuss any questions you have with your health care provider. °  °Document  Released: 03/25/2006 Document Revised: 03/08/2015 Document Reviewed: 08/03/2014 °Elsevier Interactive Patient Education ©2016 [employer] ° °

## 2015-12-10 ENCOUNTER — Encounter (HOSPITAL_BASED_OUTPATIENT_CLINIC_OR_DEPARTMENT_OTHER): Payer: Self-pay | Admitting: *Deleted

## 2015-12-10 ENCOUNTER — Emergency Department (HOSPITAL_BASED_OUTPATIENT_CLINIC_OR_DEPARTMENT_OTHER)
Admission: EM | Admit: 2015-12-10 | Discharge: 2015-12-10 | Disposition: A | Discharge status: Home / Self Care | Payer: PRIVATE HEALTH INSURANCE | Attending: Physician Assistant | Admitting: Physician Assistant

## 2015-12-10 ENCOUNTER — Emergency Department (HOSPITAL_BASED_OUTPATIENT_CLINIC_OR_DEPARTMENT_OTHER): Payer: PRIVATE HEALTH INSURANCE

## 2015-12-10 DIAGNOSIS — Z3202 Encounter for pregnancy test, result negative: Secondary | ICD-10-CM | POA: Insufficient documentation

## 2015-12-10 DIAGNOSIS — F17210 Nicotine dependence, cigarettes, uncomplicated: Secondary | ICD-10-CM | POA: Insufficient documentation

## 2015-12-10 DIAGNOSIS — Z79899 Other long term (current) drug therapy: Secondary | ICD-10-CM | POA: Insufficient documentation

## 2015-12-10 DIAGNOSIS — R42 Dizziness and giddiness: Secondary | ICD-10-CM

## 2015-12-10 DIAGNOSIS — R110 Nausea: Secondary | ICD-10-CM | POA: Insufficient documentation

## 2015-12-10 DIAGNOSIS — R002 Palpitations: Secondary | ICD-10-CM | POA: Insufficient documentation

## 2015-12-10 LAB — COMPREHENSIVE METABOLIC PANEL
ALBUMIN: 3.9 g/dL (ref 3.5–5.0)
ALT: 16 U/L (ref 14–54)
ANION GAP: 7 (ref 5–15)
AST: 20 U/L (ref 15–41)
Alkaline Phosphatase: 55 U/L (ref 38–126)
BILIRUBIN TOTAL: 0.5 mg/dL (ref 0.3–1.2)
BUN: 13 mg/dL (ref 6–20)
CALCIUM: 9 mg/dL (ref 8.9–10.3)
CO2: 25 mmol/L (ref 22–32)
Chloride: 107 mmol/L (ref 101–111)
Creatinine, Ser: 0.68 mg/dL (ref 0.44–1.00)
Glucose, Bld: 118 mg/dL — ABNORMAL HIGH (ref 65–99)
POTASSIUM: 3.5 mmol/L (ref 3.5–5.1)
Sodium: 139 mmol/L (ref 135–145)
TOTAL PROTEIN: 7.4 g/dL (ref 6.5–8.1)

## 2015-12-10 LAB — URINALYSIS, ROUTINE W REFLEX MICROSCOPIC
BILIRUBIN URINE: NEGATIVE
GLUCOSE, UA: NEGATIVE mg/dL
KETONES UR: 15 mg/dL — AB
LEUKOCYTES UA: NEGATIVE
Nitrite: NEGATIVE
PROTEIN: NEGATIVE mg/dL
Specific Gravity, Urine: 1.026 (ref 1.005–1.030)
pH: 6 (ref 5.0–8.0)

## 2015-12-10 LAB — CBC WITH DIFFERENTIAL/PLATELET
BASOS ABS: 0 10*3/uL (ref 0.0–0.1)
BASOS PCT: 0 %
Eosinophils Absolute: 0.1 10*3/uL (ref 0.0–0.7)
Eosinophils Relative: 1 %
HEMATOCRIT: 40.7 % (ref 36.0–46.0)
Hemoglobin: 13.4 g/dL (ref 12.0–15.0)
LYMPHS PCT: 26 %
Lymphs Abs: 2.6 10*3/uL (ref 0.7–4.0)
MCH: 29.8 pg (ref 26.0–34.0)
MCHC: 32.9 g/dL (ref 30.0–36.0)
MCV: 90.4 fL (ref 78.0–100.0)
Monocytes Absolute: 0.4 10*3/uL (ref 0.1–1.0)
Monocytes Relative: 4 %
NEUTROS ABS: 6.9 10*3/uL (ref 1.7–7.7)
NEUTROS PCT: 69 %
Platelets: 221 10*3/uL (ref 150–400)
RBC: 4.5 MIL/uL (ref 3.87–5.11)
RDW: 12.8 % (ref 11.5–15.5)
WBC: 10 10*3/uL (ref 4.0–10.5)

## 2015-12-10 LAB — URINE MICROSCOPIC-ADD ON

## 2015-12-10 LAB — PREGNANCY, URINE: Preg Test, Ur: NEGATIVE

## 2015-12-10 LAB — D-DIMER, QUANTITATIVE: D-Dimer, Quant: <0.27 ug/mL-FEU (ref 0.00–0.50)

## 2015-12-10 MED ORDER — SODIUM CHLORIDE 0.9 % IV BOLUS (SEPSIS)
1000.0000 mL | Freq: Once | INTRAVENOUS | Status: AC
  Administered 2015-12-10: 1000 mL via INTRAVENOUS

## 2015-12-10 NOTE — ED Notes (Signed)
Pt reports dizziness x 3 hours, unable to complete work- denies n/v- grips equal, facial symmetry present, speech clear- states "I feel like I'm high"- reports dizziness comes in waves- pt drove self here

## 2015-12-10 NOTE — ED Provider Notes (Signed)
CSN: [id number]     Arrival date & time 12/10/15  1715 History  By signing my name below, I, Freida Busman, attest that this documentation has been prepared under the direction and in the presence of Teghan Philbin Randall An, MD . Electronically Signed: Freida Busman, Scribe. 12/10/2015. 6:04 PM.    Chief Complaint  Patient presents with  . Dizziness   The history is provided by the patient. No language interpreter was used.   HPI Comments:  Natalie Gilmore is a 30 y.o. female who presents to the Emergency Department complaining of intermittent episodes of  dizziness x ~ 3 hours. She was at work in a [employer] when symptoms began. She reports associated palpitations and nausea. She denies increase in caffeine intake today but notes she has been drinking water with apple cider vinegar for ~ 1 week.  No alleviating factors noted. She also notes recent 12 hour drive to [place]. She denies h/o DVT/PE, LE swelling, abdominal pain and use of BCP.   History reviewed. No pertinent past medical history. Past Surgical History  Procedure Laterality Date  . Appendectomy    . Dilation and curettage of uterus     No family history on file. Social History  Substance Use Topics  . Smoking status: Current Every Day Smoker -- 0.50 packs/day    Types: Cigarettes  . Smokeless tobacco: Never Used  . Alcohol Use: Yes     Comment: occ   OB History    No data available     Review of Systems  10 systems reviewed and all are negative for acute change except as noted in the HPI.  Allergies  Review of patient's allergies indicates no known allergies.  Home Medications   Prior to Admission medications   Medication Sig Start Date End Date Taking? Authorizing Provider  dicyclomine (BENTYL) 20 MG tablet Take 1 tablet (20 mg total) by mouth 3 (three) times daily before meals. 11/15/15   Tilden Fossa, MD  omeprazole (PRILOSEC) 20 MG capsule Take 1 capsule (20 mg total) by mouth daily. 11/15/15   Tilden Fossa, MD   BP 124/60 mmHg  Pulse 94  Temp(Src) 98.3 F (36.8 C) (Oral)  Resp 27  Ht  (1.753 m)  Wt 280 lb 1.6 oz (127.053 kg)  BMI 41.34 kg/m2  SpO2 100%  LMP 11/11/2015 Physical Exam  Constitutional: She is oriented to person, place, and time. She appears well-developed and well-nourished. No distress.  HENT:  Head: Normocephalic and atraumatic.  Eyes: Conjunctivae are normal.  Cardiovascular: Normal rate, regular rhythm and normal heart sounds.   Pulmonary/Chest: Effort normal and breath sounds normal. No respiratory distress.  Abdominal: Soft. She exhibits no distension. There is no tenderness.  Musculoskeletal: Normal range of motion. She exhibits no edema or tenderness.  Neurological: She is alert and oriented to person, place, and time.  Skin: Skin is warm and dry.  Psychiatric: She has a normal mood and affect.  Nursing note and vitals reviewed.   ED Course  Procedures   DIAGNOSTIC STUDIES:  Oxygen Saturation is 100% on RA, normal by my interpretation.    COORDINATION OF CARE:  5:57 PM Will order CXR, EKG and lab work. Will also administer fluids in the ED. Discussed treatment plan with pt at bedside and pt agreed to plan.  Labs Review Labs Reviewed  URINALYSIS, ROUTINE W REFLEX MICROSCOPIC (NOT AT [facility]) - Abnormal; Notable for the following:    Hgb urine dipstick SMALL (*)    Ketones,  ur 15 (*)    All other components within normal limits  COMPREHENSIVE METABOLIC PANEL - Abnormal; Notable for the following:    Glucose, Bld 118 (*)    All other components within normal limits  URINE MICROSCOPIC-ADD ON - Abnormal; Notable for the following:    Squamous Epithelial / LPF 0-5 (*)    Bacteria, UA MANY (*)    All other components within normal limits  PREGNANCY, URINE  CBC WITH DIFFERENTIAL/PLATELET  D-DIMER, QUANTITATIVE (NOT AT [facility])    Imaging Review Dg Chest 2 View  12/10/2015  CLINICAL DATA:  Disease heart palpitations today chest pain EXAM: CHEST   2 VIEW COMPARISON:  None. FINDINGS: The heart size and mediastinal contours are within normal limits. Both lungs are clear. The visualized skeletal structures are unremarkable. IMPRESSION: No active cardiopulmonary disease. Electronically Signed   By: Esperanza Heir M.D.   On: 12/10/2015 18:26   I have personally reviewed and evaluated these images and lab results as part of my medical decision-making.   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 Patient reports that she felt like her heart was racing and she felt nauseated and sweaty and dizzy. Patient sounds as if she had a vasovagal reaction.  No increase caffeine intake.  Patient does have risk factor for PE with a long 12 hour car trip to [place] and back.  We will get EKG CBC Chem-7 and d-dimer. Patient has normal vital signs and orthostatics.  Of note patient has been on a apple cider vinegar diet, we will check X make sure that is not causing electrolyte imbalance causing the symptoms.  I personally performed the services described in this documentation, which was scribed in my presence. The recorded information has been reviewed and is accurate.   8:13 PM Patietn with normal lab, ekg and ddimer. Normal vitalsand taking PO, ambulatory at time of discharge.    Advised to do water instead of apple cider vinegar and follow up with PCP.   Tay Whitwell Randall An, MD 12/10/15 2014

## 2015-12-10 NOTE — Discharge Instructions (Signed)
Please return with concerns.  Follow up with primary care doctor.

## 2016-05-31 ENCOUNTER — Emergency Department (HOSPITAL_COMMUNITY)
Admission: EM | Admit: 2016-05-31 | Discharge: 2016-05-31 | Disposition: A | Discharge status: Home / Self Care | Payer: PRIVATE HEALTH INSURANCE | Attending: Emergency Medicine | Admitting: Emergency Medicine

## 2016-05-31 ENCOUNTER — Encounter (HOSPITAL_COMMUNITY): Payer: Self-pay | Admitting: *Deleted

## 2016-05-31 DIAGNOSIS — Z79899 Other long term (current) drug therapy: Secondary | ICD-10-CM | POA: Insufficient documentation

## 2016-05-31 DIAGNOSIS — M546 Pain in thoracic spine: Secondary | ICD-10-CM

## 2016-05-31 DIAGNOSIS — F17210 Nicotine dependence, cigarettes, uncomplicated: Secondary | ICD-10-CM | POA: Insufficient documentation

## 2016-05-31 LAB — URINALYSIS, ROUTINE W REFLEX MICROSCOPIC
Glucose, UA: NEGATIVE mg/dL
HGB URINE DIPSTICK: NEGATIVE
KETONES UR: 15 mg/dL — AB
Leukocytes, UA: NEGATIVE
NITRITE: NEGATIVE
PH: 6 (ref 5.0–8.0)
Protein, ur: NEGATIVE mg/dL
SPECIFIC GRAVITY, URINE: 1.03 (ref 1.005–1.030)

## 2016-05-31 MED ORDER — IBUPROFEN 200 MG PO TABS
600.0000 mg | ORAL_TABLET | Freq: Once | ORAL | Status: AC
  Administered 2016-05-31: 600 mg via ORAL
  Filled 2016-05-31: qty 1

## 2016-05-31 MED ORDER — NAPROXEN 500 MG PO TABS: 500.0000 mg | ORAL_TABLET | Freq: Two times a day (BID) | ORAL | 0 refills | Status: DC | PRN

## 2016-05-31 MED ORDER — METHOCARBAMOL 500 MG PO TABS
500.0000 mg | ORAL_TABLET | Freq: Once | ORAL | Status: AC
  Administered 2016-05-31: 500 mg via ORAL
  Filled 2016-05-31: qty 1

## 2016-05-31 MED ORDER — ACETAMINOPHEN 325 MG PO TABS
650.0000 mg | ORAL_TABLET | Freq: Once | ORAL | Status: AC
Start: 2016-05-31 — End: 2016-05-31
  Administered 2016-05-31: 650 mg via ORAL
  Filled 2016-05-31: qty 2

## 2016-05-31 MED ORDER — METHOCARBAMOL 500 MG PO TABS: 500.0000 mg | ORAL_TABLET | Freq: Two times a day (BID) | ORAL | 0 refills | Status: DC | PRN

## 2016-05-31 NOTE — ED Notes (Signed)
Patient able to ambulate independently  

## 2016-05-31 NOTE — ED Provider Notes (Signed)
[facility]-EMERGENCY DEPT Provider Note   CSN: [id number] Arrival date & time: 05/31/16  1821  First Provider Contact:  None   By signing my name below, I, Levon Hedger, attest that this documentation has been prepared under the direction and in the presence of non-physician practitioner, Elizabeth Sauer, PA-C Electronically Signed: Levon Hedger, Scribe. 05/31/2016. 8:54 PM.   History   Chief Complaint Chief Complaint  Patient presents with  . Back Pain    HPI Natalie Gilmore is an otherwise healthy 30 y.o. female who presents to the Emergency Department complaining of moderate, intermittent right-sided back pain onset two days ago. She denies any injury. Pt reports pain is aggravated by deep breaths and movement and is alleviated by lying still. Pt also notes shortness of breath due to pain and dysuria (which has since resolved 3-4 days ago). She denies abdominal pain, saddle anesthesia, fevers, cough, nausea, and vomiting.   The history is provided by the patient. No language interpreter was used.    Past Surgical History:  Procedure Laterality Date  . APPENDECTOMY    . DILATION AND CURETTAGE OF UTERUS      OB History    No data available       Home Medications    Prior to Admission medications   Medication Sig Start Date End Date Taking? Authorizing Provider  dicyclomine (BENTYL) 20 MG tablet Take 1 tablet (20 mg total) by mouth 3 (three) times daily before meals. 11/15/15   Tilden Fossa, MD  methocarbamol (ROBAXIN) 500 MG tablet Take 1 tablet (500 mg total) by mouth 2 (two) times daily as needed for muscle spasms. 05/31/16   Chase Picket Liann Spaeth, PA-C  naproxen (NAPROSYN) 500 MG tablet Take 1 tablet (500 mg total) by mouth 2 (two) times daily as needed. 05/31/16   Chase Picket Elaf Clauson, PA-C  omeprazole (PRILOSEC) 20 MG capsule Take 1 capsule (20 mg total) by mouth daily. 11/15/15   Tilden Fossa, MD    Family History History reviewed. No pertinent family history.  Social  History Social History  Substance Use Topics  . Smoking status: Current Every Day Smoker    Packs/day: 0.50    Types: Cigarettes  . Smokeless tobacco: Never Used  . Alcohol use Yes     Comment: occ     Allergies   Review of patient's allergies indicates no known allergies.   Review of Systems Review of Systems  Constitutional: Negative for fever.  HENT: Negative for congestion.   Respiratory: Positive for shortness of breath. Negative for cough.   Cardiovascular: Negative for chest pain, palpitations and leg swelling.  Gastrointestinal: Negative for abdominal pain, nausea and vomiting.  Genitourinary: Positive for dysuria. Negative for frequency, urgency, vaginal discharge and vaginal pain.     Physical Exam Updated Vital Signs BP 104/64 (BP Location: Right Arm)   Pulse (!) 56   Temp 98.4 F (36.9 C) (Oral)   Resp 12   Ht  (1.753 m)   Wt 124.7 kg   LMP 05/11/2016   SpO2 100%   BMI 40.61 kg/m   Physical Exam  Constitutional: She appears well-developed and well-nourished. No distress.  HENT:  Head: Normocephalic and atraumatic.  Neck: Neck supple.  Cardiovascular: Normal rate, regular rhythm and normal heart sounds.   No murmur heard. Pulmonary/Chest: Effort normal and breath sounds normal. No respiratory distress. She has no wheezes. She has no rales. She exhibits no tenderness.  Abdominal: Soft. Bowel sounds are normal. She exhibits no distension. There is no  tenderness.  No CVA tenderness.  Musculoskeletal:  No midline C, T, L spine tenderness. Tenderness to palpation of right paraspinal musculature. Full ROM.   Neurological: She is alert.  Bilateral lower extremities neurovascularly intact.   Skin: Skin is warm and dry.  Psychiatric: She has a normal mood and affect.  Nursing note and vitals reviewed.   ED Treatments / Results  DIAGNOSTIC STUDIES:  Oxygen Saturation is 100% on RA, normal by my interpretation.    COORDINATION OF CARE:  7:54 PM  Will order urinalysis, ibuprofen and Toradol. Discussed treatment plan with pt at bedside and pt agreed to plan.  Labs (all labs ordered are listed, but only abnormal results are displayed) Labs Reviewed  URINALYSIS, ROUTINE W REFLEX MICROSCOPIC (NOT AT [facility]) - Abnormal; Notable for the following:       Result Value   Bilirubin Urine SMALL (*)    Ketones, ur 15 (*)    All other components within normal limits    EKG  EKG Interpretation None       Radiology No results found.  Procedures Procedures (including critical care time)  Medications Ordered in ED Medications  acetaminophen (TYLENOL) tablet 650 mg (650 mg Oral Given 05/31/16 2115)  ibuprofen (ADVIL,MOTRIN) tablet 600 mg (600 mg Oral Given 05/31/16 2115)  methocarbamol (ROBAXIN) tablet 500 mg (500 mg Oral Given 05/31/16 2114)     Initial Impression / Assessment and Plan / ED Course  I have reviewed the triage vital signs and the nursing notes.  Pertinent labs & imaging results that were available during my care of the patient were reviewed by me and considered in my medical decision making (see chart for details).  Clinical Course   [name] presents to ED for right sided back pain. On exam, no midline tenderness. Bilateral LE's NVI. No red flag symptoms of back pain. Patient states that she had dysuria several days ago which has now resolved. Will check UA, however no CVA or suprapubic tenderness.   UA obtained with no signs of infection.   Patient ambulatory in ED without difficulty. I have reviewed return precautions, including development of any red flags symptoms of back pain, patient voiced understanding. Rx for Robaxin and naproxen. PCP follow-up strongly recommended if symptoms do not improve in 1 week. Patient voiced understanding and agreement with plan. All questions answered.   Final Clinical Impressions(s) / ED Diagnoses   Final diagnoses:  Acute right-sided thoracic back pain  I personally  performed the services described in this documentation, which was scribed in my presence. The recorded information has been reviewed and is accurate.  New Prescriptions Discharge Medication List as of 05/31/2016  9:36 PM    START taking these medications   Details  methocarbamol (ROBAXIN) 500 MG tablet Take 1 tablet (500 mg total) by mouth 2 (two) times daily as needed for muscle spasms., Starting Thu 05/31/2016, Print    naproxen (NAPROSYN) 500 MG tablet Take 1 tablet (500 mg total) by mouth 2 (two) times daily as needed., Starting Thu 05/31/2016, Print         [facility] Taneeka Curtner, PA-C 06/01/16 0020    Nelva Nay, MD 06/07/16 [phone]

## 2016-05-31 NOTE — ED Triage Notes (Signed)
Pt reports turning her body and then onset of right side back pain 2 days ago. Pain increases with movement and "catches her breath" which is causing sob. No resp distress noted and ambulatory at triage.

## 2016-05-31 NOTE — Discharge Instructions (Signed)
1. Medications: Naproxen as needed for pain, Robaxin is your muscle relaxer to take as needed - This can make you very drowsy - please do not drink alcohol, operate heavy machinery or drive on this medication, continue usual home medications 2. Treatment: rest, drink plenty of fluids, ice or heat to affected area will aide in pain relief as well.  3. Follow Up: Please follow up with your primary doctor in 7 days for discussion of your diagnoses and further evaluation after today's visit; Please return to the ER for new or worsening symptoms, any additional concerns.

## 2016-07-31 ENCOUNTER — Emergency Department (HOSPITAL_COMMUNITY)
Admission: EM | Admit: 2016-07-31 | Discharge: 2016-07-31 | Disposition: A | Discharge status: Home / Self Care | Payer: PRIVATE HEALTH INSURANCE | Attending: Emergency Medicine | Admitting: Emergency Medicine

## 2016-07-31 ENCOUNTER — Encounter (HOSPITAL_COMMUNITY): Payer: Self-pay

## 2016-07-31 DIAGNOSIS — R002 Palpitations: Secondary | ICD-10-CM | POA: Insufficient documentation

## 2016-07-31 DIAGNOSIS — F17210 Nicotine dependence, cigarettes, uncomplicated: Secondary | ICD-10-CM | POA: Insufficient documentation

## 2016-07-31 LAB — BASIC METABOLIC PANEL
Anion gap: 8 (ref 5–15)
BUN: 15 mg/dL (ref 6–20)
CALCIUM: 9.2 mg/dL (ref 8.9–10.3)
CO2: 23 mmol/L (ref 22–32)
Chloride: 108 mmol/L (ref 101–111)
Creatinine, Ser: 0.88 mg/dL (ref 0.44–1.00)
GFR calc non Af Amer: >60 mL/min (ref >60)
Glucose, Bld: 96 mg/dL (ref 65–99)
Potassium: 3.7 mmol/L (ref 3.5–5.1)
SODIUM: 139 mmol/L (ref 135–145)

## 2016-07-31 LAB — CBC
HCT: 40 % (ref 36.0–46.0)
HEMOGLOBIN: 13.8 g/dL (ref 12.0–15.0)
MCH: 30.3 pg (ref 26.0–34.0)
MCHC: 34.5 g/dL (ref 30.0–36.0)
MCV: 87.7 fL (ref 78.0–100.0)
PLATELETS: 217 10*3/uL (ref 150–400)
RBC: 4.56 MIL/uL (ref 3.87–5.11)
RDW: 12.7 % (ref 11.5–15.5)
WBC: 10.2 10*3/uL (ref 4.0–10.5)

## 2016-07-31 LAB — MAGNESIUM: Magnesium: 2.1 mg/dL (ref 1.7–2.4)

## 2016-07-31 MED ORDER — LORAZEPAM 1 MG PO TABS: 1.0000 mg | ORAL_TABLET | Freq: Three times a day (TID) | ORAL | 0 refills | Status: DC | PRN

## 2016-07-31 NOTE — Discharge Instructions (Signed)
Return to the ED with any concerns including chest pain, difficulty breathing, fainting, thoughts or feelings of suicide or homicide, decreased level of alertness/lethargy, or any other alarming symptoms

## 2016-07-31 NOTE — ED Triage Notes (Signed)
Pt states palpitations x 3 weeks. Having episodes of feeling hot and short of breath.  Pt states symptoms lasted 3 days and stopped.  Pt noted to have PVC on monitor.  Not painful

## 2016-07-31 NOTE — ED Notes (Signed)
Patient was alert, oriented and stable upon discharge. RN went over AVS and patient had no further questions.  

## 2016-07-31 NOTE — ED Provider Notes (Signed)
[facility]-EMERGENCY DEPT Provider Note   CSN: [id number] Arrival date & time: 07/31/16  1619     History   Chief Complaint Chief Complaint  Patient presents with  . Palpitations    HPI Natalie Gilmore is a 30 y.o. female.  HPI  Pt presenting with c/o palpitations.  She states that last week for 3 days in a row she felt her heart was racing- she states she feels like she is "out of her body"  And feels a tightness in her throat.  She denies chest pain but feels that her heart is pounding and skipping beats.  No lightheadedness.  No focal weakness or changes in vision or speech.  She denies feeling anxiety.  She states she has googled her symptoms and feels she must have had panic attacks.  No difficulty breathing.  Symptoms recurred today while watching TV with a friend.  Denies recent illness. No SI/HI.  No fever.  There are no other associated systemic symptoms, there are no other alleviating or modifying factors.   History reviewed. No pertinent past medical history.  There are no active problems to display for this patient.   Past Surgical History:  Procedure Laterality Date  . APPENDECTOMY    . DILATION AND CURETTAGE OF UTERUS      OB History    No data available       Home Medications    Prior to Admission medications   Medication Sig Start Date End Date Taking? Authorizing Provider  dicyclomine (BENTYL) 20 MG tablet Take 1 tablet (20 mg total) by mouth 3 (three) times daily before meals. Patient not taking: Reported on 07/31/2016 11/15/15   Tilden FossaElizabeth Rees, MD  LORazepam (ATIVAN) 1 MG tablet Take 1 tablet (1 mg total) by mouth 3 (three) times daily as needed for anxiety. 07/31/16   Jerelyn ScottMartha Linker, MD  methocarbamol (ROBAXIN) 500 MG tablet Take 1 tablet (500 mg total) by mouth 2 (two) times daily as needed for muscle spasms. Patient not taking: Reported on 07/31/2016 05/31/16   [facility] Center[facility]ime Pilcher Ward, PA-C  naproxen (NAPROSYN) 500 MG tablet Take 1 tablet (500 mg total) by mouth 2  (two) times daily as needed. Patient not taking: Reported on 07/31/2016 05/31/16   [facility]  omeprazole (PRILOSEC) 20 MG capsule Take 1 capsule (20 mg total) by mouth daily. Patient not taking: Reported on 07/31/2016 11/15/15   Tilden FossaElizabeth Rees, MD    Family History History reviewed. No pertinent family history.  Social History Social History  Substance Use Topics  . Smoking status: Current Every Day Smoker    Packs/day: 0.50    Types: Cigarettes  . Smokeless tobacco: Never Used  . Alcohol use Yes     Comment: occ     Allergies   Review of patient's allergies indicates no known allergies.   Review of Systems Review of Systems  ROS reviewed and all otherwise negative except for mentioned in HPI   Physical Exam Updated Vital Signs BP 121/95 (BP Location: Right Arm)   Pulse 77   Temp 98.4 F (36.9 C)   Resp 16   SpO2 99%  Vitals reviewed Physical Exam Physical Examination: General appearance - alert, well appearing, and in no distress Mental status - alert, oriented to person, place, and time Eyes - no conjunctival injection, no scleral icterus Chest - clear to auscultation, no wheezes, rales or rhonchi, symmetric air entry Heart - normal rate, regular rhythm, normal S1, S2, no murmurs, rubs, clicks or gallops Abdomen -  soft, nontender, nondistended, no masses or organomegaly Neurological - alert, oriented, normal speech Extremities - peripheral pulses normal, no pedal edema, no clubbing or cyanosis Skin - normal coloration and turgor, no rashes  ED Treatments / Results  Labs (all labs ordered are listed, but only abnormal results are displayed) Labs Reviewed  CBC  BASIC METABOLIC PANEL  MAGNESIUM    EKG  EKG Interpretation  Date/Time:  Tuesday July 31 2016 16:51:51 EDT Ventricular Rate:  94 PR Interval:    QRS Duration: 137 QT Interval:  427 QTC Calculation: 534 R Axis:   71 Text Interpretation:  Sinus rhythm Ventricular bigeminy   Nonspecific intraventricular conduction delay Repol abnrm suggests ischemia, inferior leads Confirmed by Fayrene Fearing  MD, MARK ([id number]) on 07/31/2016 5:21:15 PM       Radiology No results found.  Procedures Procedures (including critical care time)  Medications Ordered in ED Medications - No data to display   Initial Impression / Assessment and Plan / ED Course  I have reviewed the triage vital signs and the nursing notes.  Pertinent labs & imaging results that were available during my care of the patient were reviewed by me and considered in my medical decision making (see chart for details).  Clinical Course    Pt presenting with c/o palpitations.  Her symptoms also may be related to panic attacks.  Workup in the ED is reassuring- ekg and labs are reassuring.  Pt given referral to wellness center and to [facility].  Given #6 ativan tabs to use prn if symptoms re-occur.  Discharged with strict return precautions.  Pt agreeable with plan.  Final Clinical Impressions(s) / ED Diagnoses   Final diagnoses:  Palpitations    New Prescriptions Discharge Medication List as of 07/31/2016  9:27 PM    START taking these medications   Details  LORazepam (ATIVAN) 1 MG tablet Take 1 tablet (1 mg total) by mouth 3 (three) times daily as needed for anxiety., Starting Tue 07/31/2016, Print         Jerelyn Scott, MD 08/01/16 [phone]

## 2017-03-31 IMAGING — US US ABDOMEN LIMITED
1 series · 14 of 25 positions shown · non-contrast
Comparison: CT scan of September 12, 2014.

CLINICAL DATA: Gastroparesis.

EXAM:
US ABDOMEN LIMITED - RIGHT UPPER QUADRANT

[Series 1: us abdomen limited · 0.22mm/px · 14 of 54 slices shown]
[im 1/54]
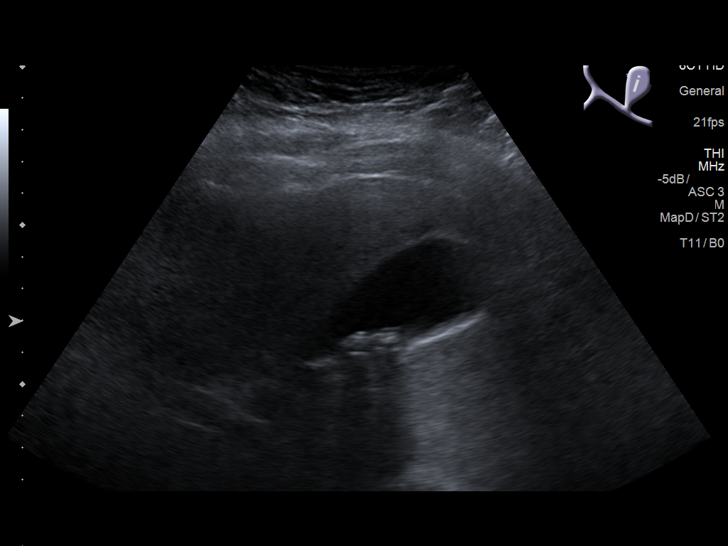
[im 5/54]
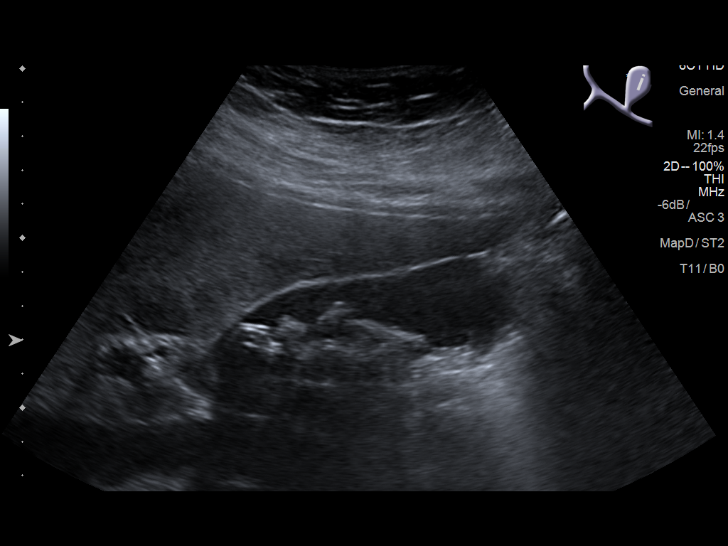
[im 9/54]
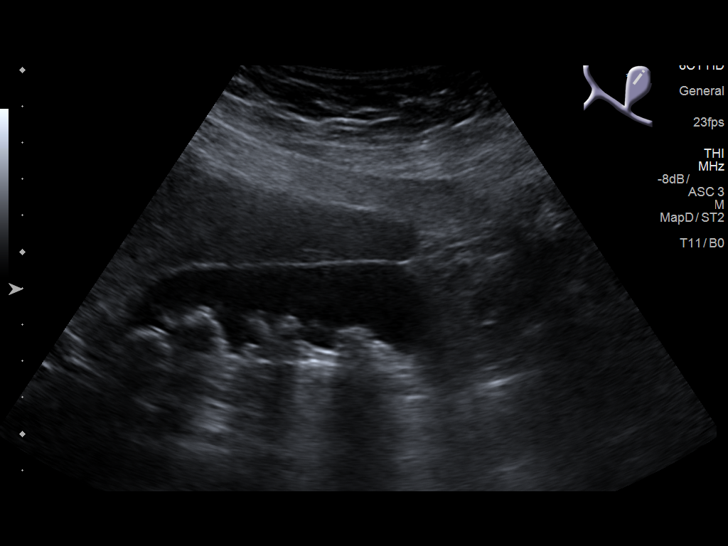
[im 14/54]
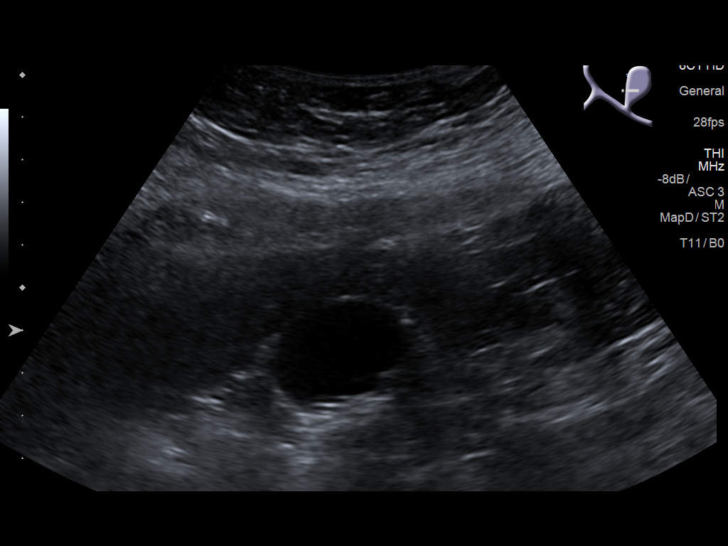
[im 18/54]
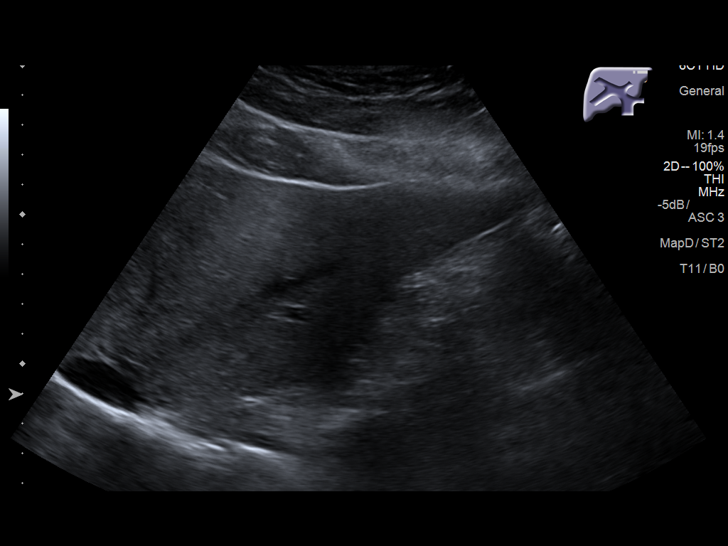
[im 20/54]
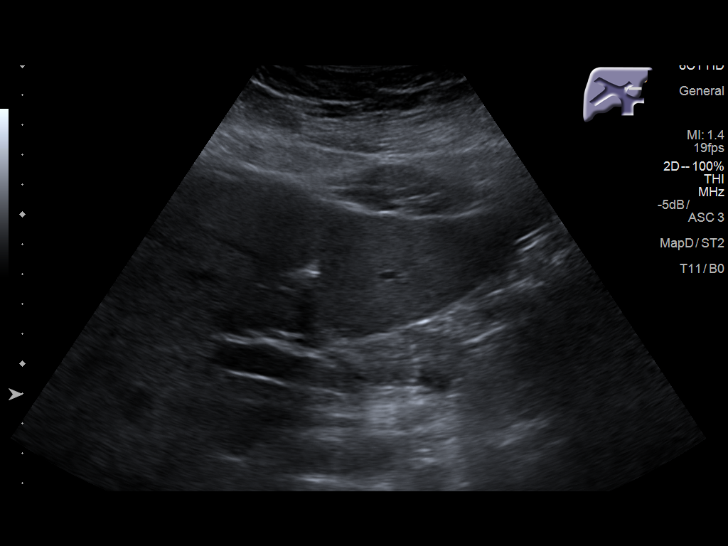
[im 25/54]
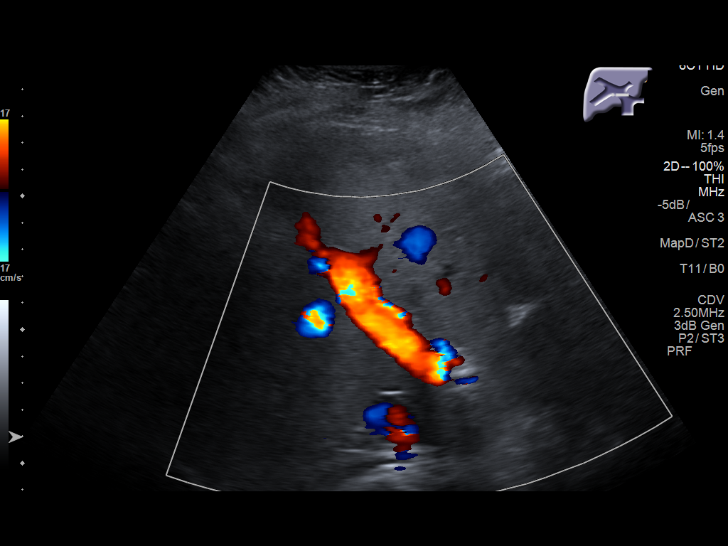
[im 29/54]
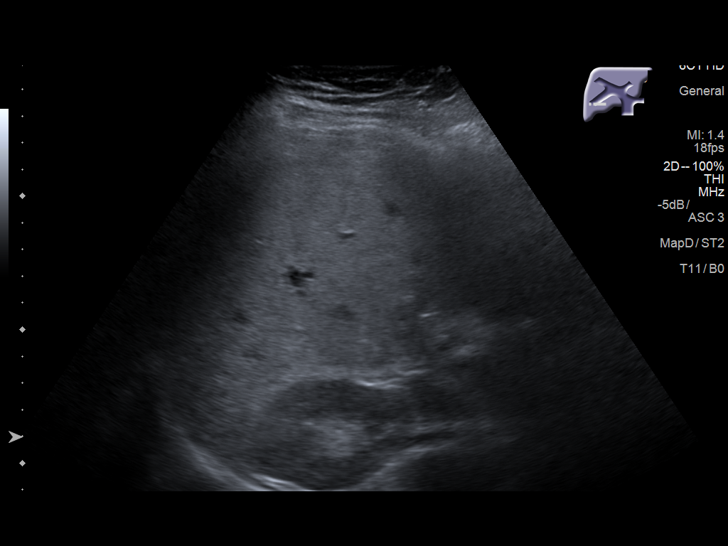
[im 34/54]
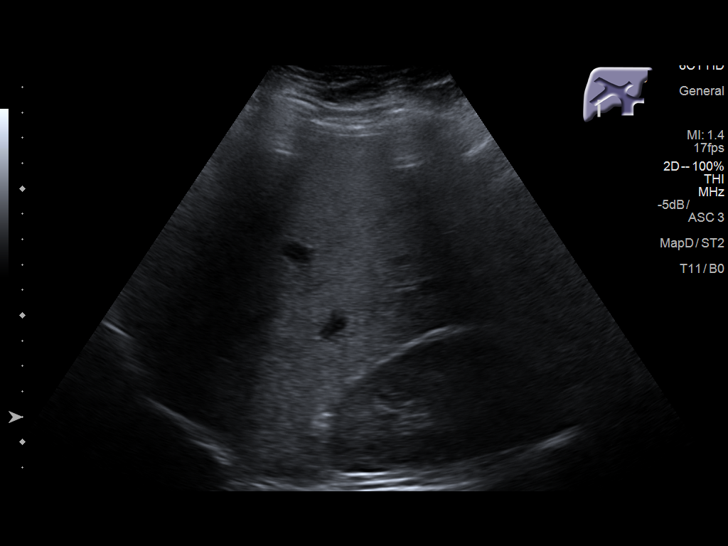
[im 36/54]
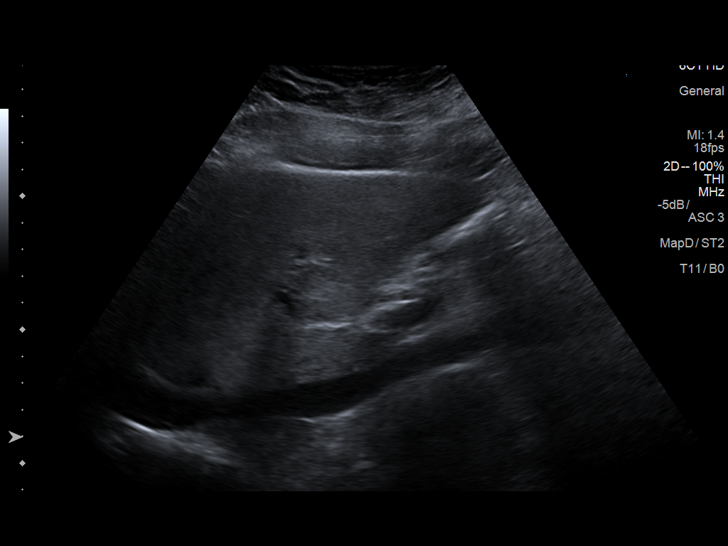
[im 40/54]
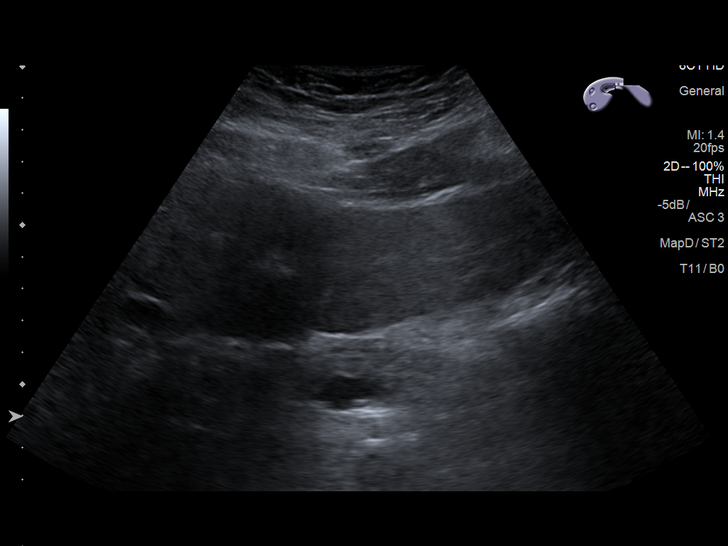
[im 45/54]
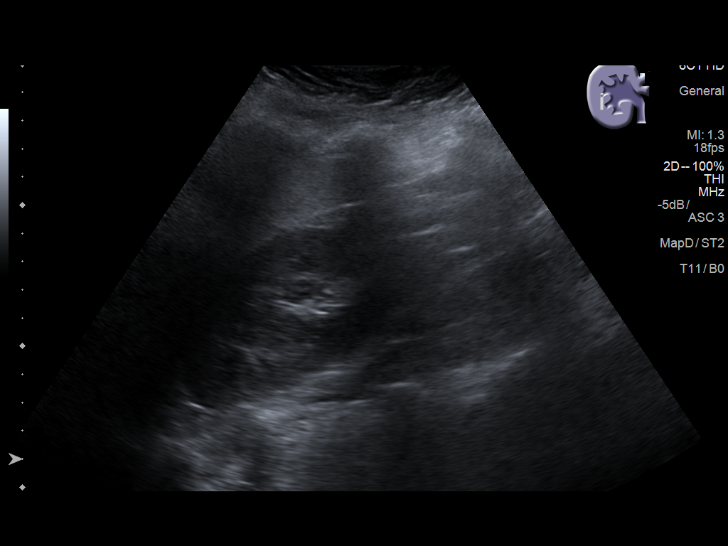
[im 49/54]
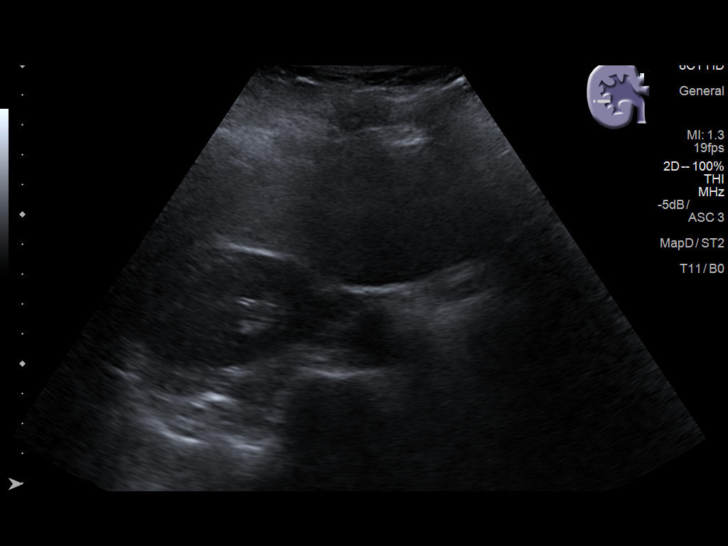
[im 54/54]
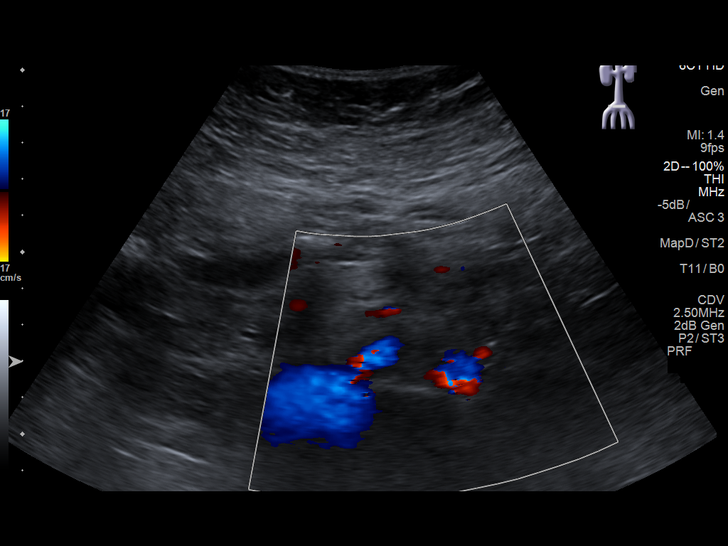

[14 of 25 positions shown; findings below may reference images not displayed]

FINDINGS: Gallbladder:

Cholelithiasis is noted without gallbladder wall thickening or
pericholecystic fluid. No sonographic Murphy's sign is noted.

Common bile duct:

Diameter: 2 mm which is within normal limits.

Liver:

No focal lesion identified. Increased echogenicity is noted
consistent with fatty infiltration.

Right kidney measures 12 cm in length and is within normal limits.
Visualized portion of abdominal aorta appears normal. Visualized
portion of pancreas appears normal.
IMPRESSION: Cholelithiasis is noted without evidence of cholecystitis. Fatty
infiltration of the liver is noted.

## 2017-04-28 ENCOUNTER — Encounter (HOSPITAL_COMMUNITY): Payer: Self-pay | Admitting: Emergency Medicine

## 2017-04-28 ENCOUNTER — Emergency Department (HOSPITAL_COMMUNITY): Payer: Self-pay

## 2017-04-28 ENCOUNTER — Emergency Department (HOSPITAL_COMMUNITY)
Admission: EM | Admit: 2017-04-28 | Discharge: 2017-04-28 | Disposition: A | Discharge status: Home / Self Care | Payer: Self-pay | Attending: Emergency Medicine | Admitting: Emergency Medicine

## 2017-04-28 DIAGNOSIS — R112 Nausea with vomiting, unspecified: Secondary | ICD-10-CM | POA: Insufficient documentation

## 2017-04-28 DIAGNOSIS — Z79899 Other long term (current) drug therapy: Secondary | ICD-10-CM | POA: Insufficient documentation

## 2017-04-28 DIAGNOSIS — K8080 Other cholelithiasis without obstruction: Secondary | ICD-10-CM | POA: Insufficient documentation

## 2017-04-28 DIAGNOSIS — K8020 Calculus of gallbladder without cholecystitis without obstruction: Secondary | ICD-10-CM

## 2017-04-28 DIAGNOSIS — F17210 Nicotine dependence, cigarettes, uncomplicated: Secondary | ICD-10-CM | POA: Insufficient documentation

## 2017-04-28 LAB — COMPREHENSIVE METABOLIC PANEL
ALT: 19 U/L (ref 14–54)
AST: 24 U/L (ref 15–41)
Albumin: 4 g/dL (ref 3.5–5.0)
Alkaline Phosphatase: 59 U/L (ref 38–126)
Anion gap: 9 (ref 5–15)
BUN: 9 mg/dL (ref 6–20)
CHLORIDE: 104 mmol/L (ref 101–111)
CO2: 23 mmol/L (ref 22–32)
Calcium: 9 mg/dL (ref 8.9–10.3)
Creatinine, Ser: 0.83 mg/dL (ref 0.44–1.00)
Glucose, Bld: 107 mg/dL — ABNORMAL HIGH (ref 65–99)
POTASSIUM: 3.7 mmol/L (ref 3.5–5.1)
SODIUM: 136 mmol/L (ref 135–145)
Total Bilirubin: 0.5 mg/dL (ref 0.3–1.2)
Total Protein: 7.3 g/dL (ref 6.5–8.1)

## 2017-04-28 LAB — CBC
HEMATOCRIT: 42.2 % (ref 36.0–46.0)
Hemoglobin: 13.9 g/dL (ref 12.0–15.0)
MCH: 29.6 pg (ref 26.0–34.0)
MCHC: 32.9 g/dL (ref 30.0–36.0)
MCV: 90 fL (ref 78.0–100.0)
Platelets: 237 10*3/uL (ref 150–400)
RBC: 4.69 MIL/uL (ref 3.87–5.11)
RDW: 12.8 % (ref 11.5–15.5)
WBC: 8.2 10*3/uL (ref 4.0–10.5)

## 2017-04-28 LAB — I-STAT BETA HCG BLOOD, ED (MC, WL, AP ONLY): I-stat hCG, quantitative: <5 m[IU]/mL (ref <5)

## 2017-04-28 LAB — URINALYSIS, ROUTINE W REFLEX MICROSCOPIC
Bilirubin Urine: NEGATIVE
GLUCOSE, UA: NEGATIVE mg/dL
HGB URINE DIPSTICK: NEGATIVE
Ketones, ur: 5 mg/dL — AB
LEUKOCYTES UA: NEGATIVE
Nitrite: NEGATIVE
PH: 6 (ref 5.0–8.0)
Protein, ur: NEGATIVE mg/dL
Specific Gravity, Urine: 1.03 (ref 1.005–1.030)

## 2017-04-28 LAB — LIPASE, BLOOD: LIPASE: 26 U/L (ref 11–51)

## 2017-04-28 MED ORDER — ONDANSETRON HCL 4 MG/2ML IJ SOLN
4.0000 mg | Freq: Once | INTRAMUSCULAR | Status: AC
  Administered 2017-04-28: 4 mg via INTRAVENOUS
  Filled 2017-04-28: qty 2

## 2017-04-28 MED ORDER — GI COCKTAIL ~~LOC~~
30.0000 mL | Freq: Once | ORAL | Status: AC
  Administered 2017-04-28: 30 mL via ORAL
  Filled 2017-04-28: qty 30

## 2017-04-28 MED ORDER — OMEPRAZOLE 20 MG PO CPDR: 20.0000 mg | DELAYED_RELEASE_CAPSULE | Freq: Every day | ORAL | 0 refills | Status: DC

## 2017-04-28 MED ORDER — HYDROCODONE-ACETAMINOPHEN 5-325 MG PO TABS: 1.0000 | ORAL_TABLET | Freq: Four times a day (QID) | ORAL | 0 refills | Status: DC | PRN

## 2017-04-28 MED ORDER — MORPHINE SULFATE (PF) 4 MG/ML IV SOLN
4.0000 mg | Freq: Once | INTRAVENOUS | Status: AC
  Administered 2017-04-28: 4 mg via INTRAVENOUS
  Filled 2017-04-28: qty 1

## 2017-04-28 MED ORDER — SODIUM CHLORIDE 0.9 % IV BOLUS (SEPSIS)
1000.0000 mL | Freq: Once | INTRAVENOUS | Status: AC
  Administered 2017-04-28: 1000 mL via INTRAVENOUS

## 2017-04-28 MED ORDER — FAMOTIDINE IN NACL 20-0.9 MG/50ML-% IV SOLN
20.0000 mg | Freq: Once | INTRAVENOUS | Status: AC
  Administered 2017-04-28: 20 mg via INTRAVENOUS
  Filled 2017-04-28: qty 50

## 2017-04-28 MED ORDER — ONDANSETRON 4 MG PO TBDP: 4.0000 mg | ORAL_TABLET | Freq: Three times a day (TID) | ORAL | 0 refills | Status: DC | PRN

## 2017-04-28 NOTE — ED Notes (Signed)
Pt returned from X-ray.  

## 2017-04-28 NOTE — ED Notes (Signed)
Pt taken to US

## 2017-04-28 NOTE — ED Provider Notes (Signed)
[facility]-EMERGENCY DEPT Provider Note   CSN: [id number] Arrival date & time: 04/28/17  0456     History   Chief Complaint Chief Complaint  Patient presents with  . Abdominal Pain    HPI Natalie Gilmore is a 31 y.o. female.  Natalie Gilmore is a 31 y.o. Female who presents the emergency department complaining of epigastric abdominal pain since last night. Patient reports starting with epigastric abdominal pain, nausea and vomiting yesterday evening that has gradually worsened. She reports she has pain after eating. She denies any diarrhea or lower abdominal pain. Last bowel movement was earlier today. Previous abdominal surgery includes an appendectomy. No treatments attempted prior to arrival. She denies fevers, hematemesis, diarrhea, chest pain, shortness of breath, or rashes.   The history is provided by the patient and medical records. No language interpreter was used.  Abdominal Pain   Associated symptoms include nausea and vomiting. Pertinent negatives include fever, diarrhea, dysuria, frequency and headaches.    History reviewed. No pertinent past medical history.  There are no active problems to display for this patient.   Past Surgical History:  Procedure Laterality Date  . APPENDECTOMY    . DILATION AND CURETTAGE OF UTERUS      OB History    No data available       Home Medications    Prior to Admission medications   Medication Sig Start Date End Date Taking? Authorizing Provider  Multiple Vitamin (MULTIVITAMIN WITH MINERALS) TABS tablet Take 1 tablet by mouth daily.   Yes [provider]  HYDROcodone-acetaminophen (NORCO/VICODIN) 5-325 MG tablet Take 1-2 tablets by mouth every 6 (six) hours as needed for moderate pain or severe pain. 04/28/17   Everlene Farrier, PA-C  omeprazole (PRILOSEC) 20 MG capsule Take 1 capsule (20 mg total) by mouth daily. 04/28/17   Everlene Farrier, PA-C  ondansetron (ZOFRAN ODT) 4 MG disintegrating tablet Take 1 tablet (4 mg  total) by mouth every 8 (eight) hours as needed for nausea or vomiting. 04/28/17   Everlene Farrier, PA-C    Family History History reviewed. No pertinent family history.  Social History Social History  Substance Use Topics  . Smoking status: Current Every Day Smoker    Packs/day: 0.50    Types: Cigarettes  . Smokeless tobacco: Never Used  . Alcohol use Yes     Comment: occ     Allergies   Patient has no known allergies.   Review of Systems Review of Systems  Constitutional: Negative for chills and fever.  HENT: Negative for congestion and sore throat.   Eyes: Negative for visual disturbance.  Respiratory: Negative for cough and shortness of breath.   Cardiovascular: Negative for chest pain.  Gastrointestinal: Positive for abdominal pain, nausea and vomiting. Negative for blood in stool and diarrhea.  Genitourinary: Negative for difficulty urinating, dysuria, frequency and urgency.  Musculoskeletal: Negative for back pain and neck pain.  Skin: Negative for rash.  Neurological: Negative for light-headedness and headaches.     Physical Exam Updated Vital Signs BP 105/68   Pulse (!) 51   Temp 98.1 F (36.7 C) (Oral)   Resp 16   Ht 5\' 9"  (1.753 m)   Wt 120.2 kg (265 lb)   LMP 04/14/2017   SpO2 97%   BMI 39.13 kg/m   Physical Exam  Constitutional: She appears well-developed and well-nourished. No distress.  Nontoxic appearing. Obese.   HENT:  Head: Normocephalic and atraumatic.  Mouth/Throat: Oropharynx is clear and moist.  Eyes: Conjunctivae are normal.  Pupils are equal, round, and reactive to light. Right eye exhibits no discharge. Left eye exhibits no discharge.  Neck: Neck supple.  Cardiovascular: Normal rate, regular rhythm, normal heart sounds and intact distal pulses.  Exam reveals no gallop and no friction rub.   No murmur heard. Pulmonary/Chest: Effort normal and breath sounds normal. No respiratory distress. She has no wheezes. She has no rales.    Abdominal: Soft. Bowel sounds are normal. She exhibits no distension and no mass. There is tenderness. There is no rebound and no guarding.  Abdomen is soft. Bowel sounds are present. Patient has epigastric abdominal tenderness to palpation. No right upper quadrant tenderness. No lower abdominal tenderness to palpation.  Musculoskeletal: She exhibits no edema.  Lymphadenopathy:    She has no cervical adenopathy.  Neurological: She is alert. Coordination normal.  Skin: Skin is warm and dry. Capillary refill takes less than 2 seconds. No rash noted. She is not diaphoretic. No erythema. No pallor.  Psychiatric: She has a normal mood and affect. Her behavior is normal.  Nursing note and vitals reviewed.    ED Treatments / Results  Labs (all labs ordered are listed, but only abnormal results are displayed) Labs Reviewed  COMPREHENSIVE METABOLIC PANEL - Abnormal; Notable for the following:       Result Value   Glucose, Bld 107 (*)    All other components within normal limits  URINALYSIS, ROUTINE W REFLEX MICROSCOPIC - Abnormal; Notable for the following:    Ketones, ur 5 (*)    All other components within normal limits  LIPASE, BLOOD  CBC  I-STAT BETA HCG BLOOD, ED ([facility], [facility], [facility] ONLY)    EKG  EKG Interpretation None       Radiology [place] Abdomen Limited  Result Date: 04/28/2017 CLINICAL DATA:  Epigastric abdominal pain.  Nausea. EXAM: ULTRASOUND ABDOMEN LIMITED RIGHT UPPER QUADRANT COMPARISON:  11/15/2015 FINDINGS: Gallbladder: Several dependent gallstones within the lumen gallbladder measuring up to 1 cm. No gallbladder wall thickening or pericholecystic fluid. Negative sonographic Murphy's sign. Common bile duct: Diameter: Normal at 3 mm Liver: No focal lesion identified. Within normal limits in parenchymal echogenicity. IMPRESSION: Cholelithiasis without evidence cholecystitis. Electronically Signed   By: Genevive BiStewart  Edmunds M.D.   On: 04/28/2017 09:48    Procedures Procedures  (including critical care time)  Medications Ordered in ED Medications  sodium chloride 0.9 % bolus 1,000 mL (0 mLs Intravenous Stopped 04/28/17 0926)  ondansetron (ZOFRAN) injection 4 mg (4 mg Intravenous Given 04/28/17 0812)  morphine 4 MG/ML injection 4 mg (4 mg Intravenous Given 04/28/17 0815)  famotidine (PEPCID) IVPB 20 mg premix (0 mg Intravenous Stopped 04/28/17 0849)  gi cocktail (Maalox,Lidocaine,Donnatal) (30 mLs Oral Given 04/28/17 0821)  morphine 4 MG/ML injection 4 mg (4 mg Intravenous Given 04/28/17 1251)  ondansetron (ZOFRAN) injection 4 mg (4 mg Intravenous Given 04/28/17 1247)     Initial Impression / Assessment and Plan / ED Course  I have reviewed the triage vital signs and the nursing notes.  Pertinent labs & imaging results that were available during my care of the patient were reviewed by me and considered in my medical decision making (see chart for details).    This  is a 31 y.o. Female who presents the emergency department complaining of epigastric abdominal pain since last night. Patient reports starting with epigastric abdominal pain, nausea and vomiting yesterday evening that has gradually worsened.  On exam patient is afebrile nontoxic appearing. Her abdomen is soft and she has epigastric  abdominal tenderness to palpation. No peritoneal signs. Pregnancy test is negative. Lipase is within normal limits. Urine is about 7 infection. CMP shows normal liver enzymes. CBC is unremarkable. We'll provide patient with fluids, nausea and pain medicine and obtain right upper quadrant ultrasound. At reevaluation prior to the patient's ultrasound she reports her abdominal pain has resolved. She is feeling much better. Ultrasound reveals cholelithiasis without evidence of cholecystitis. At reevaluation patient has had return of pain. Will speak with general surgery and provided more pain medicine. Gen. surgery evaluated the patient. Patient requests to go home at this time despite  her continued pain. She is able to tolerate by mouth without vomiting. Her pain is better controlled after more pain medicine. General surgery evaluated the patient and agrees with plan. She will follow-up as an outpatient. Omeprazole, Zofran and Vicodin for pain control. The patient was looked up on the [place] controlled substance database. No recent prescriptions for narcotic pain medicines. I discussed narcotic pain medication precautions. I discussed return precautions. She is tolerating by mouth prior to discharge without vomiting or nausea. I advised the patient to follow-up with their primary care provider this week. I advised the patient to return to the emergency department with new or worsening symptoms or new concerns. The patient verbalized understanding and agreement with plan.      Final Clinical Impressions(s) / ED Diagnoses   Final diagnoses:  Symptomatic cholelithiasis  Non-intractable vomiting with nausea, unspecified vomiting type    New Prescriptions New Prescriptions   HYDROCODONE-ACETAMINOPHEN (NORCO/VICODIN) 5-325 MG TABLET    Take 1-2 tablets by mouth every 6 (six) hours as needed for moderate pain or severe pain.   OMEPRAZOLE (PRILOSEC) 20 MG CAPSULE    Take 1 capsule (20 mg total) by mouth daily.   ONDANSETRON (ZOFRAN ODT) 4 MG DISINTEGRATING TABLET    Take 1 tablet (4 mg total) by mouth every 8 (eight) hours as needed for nausea or vomiting.     Everlene Farrier, PA-C 04/28/17 1459    Alvira Monday, MD 05/01/17 [phone]

## 2017-04-28 NOTE — Consult Note (Signed)
Reason for Consult:epigastric pain Referring Physici2an: Will Dansie, PA  Natalie Gilmore is an 31 y.o. female.  HPI: 31 year old morbidly obese Caucasian female came in with 12-18 hour history of epigastric pain. It was not getting better plus she had had several episodes of nausea vomiting so she came to the emergency room. She reports something similar about a year ago but deathly not this intense. She denies any fever, chills, melena or hematochezia. She denies any acholic stools. She denies any frequent NSAID use. She has had an appendectomy in the past.  History reviewed. No pertinent past medical history.  Past Surgical History:  Procedure Laterality Date  . APPENDECTOMY    . DILATION AND CURETTAGE OF UTERUS      History reviewed. No pertinent family history.  Social History:  reports that she has been smoking Cigarettes.  She has been smoking about 0.50 packs per day. She has never used smokeless tobacco. She reports that she drinks alcohol. She reports that she does not use drugs.  Allergies: No Known Allergies  Medications: I have reviewed the patient's current medications.  Results for orders placed or performed during the hospital encounter of 04/28/17 (from the past 48 hour(s))  Lipase, blood     Status: None   Collection Time: 04/28/17  5:11 AM  Result Value Ref Range   Lipase 26 11 - 51 U/L  Comprehensive metabolic panel     Status: Abnormal   Collection Time: 04/28/17  5:11 AM  Result Value Ref Range   Sodium 136 135 - 145 mmol/L   Potassium 3.7 3.5 - 5.1 mmol/L   Chloride 104 101 - 111 mmol/L   CO2 23 22 - 32 mmol/L   Glucose, Bld 107 (H) 65 - 99 mg/dL   BUN 9 6 - 20 mg/dL   Creatinine, Ser 0.83 0.44 - 1.00 mg/dL   Calcium 9.0 8.9 - 10.3 mg/dL   Total Protein 7.3 6.5 - 8.1 g/dL   Albumin 4.0 3.5 - 5.0 g/dL   AST 24 15 - 41 U/L   ALT 19 14 - 54 U/L   Alkaline Phosphatase 59 38 - 126 U/L   Total Bilirubin 0.5 0.3 - 1.2 mg/dL   GFR calc non Af Amer >60 >60  mL/min   GFR calc Af Amer >60 >60 mL/min    Comment: (NOTE) The eGFR has been calculated using the CKD EPI equation. This calculation has not been validated in all clinical situations. eGFR's persistently <60 mL/min signify possible Chronic Kidney Disease.    Anion gap 9 5 - 15  CBC     Status: None   Collection Time: 04/28/17  5:11 AM  Result Value Ref Range   WBC 8.2 4.0 - 10.5 K/uL   RBC 4.69 3.87 - 5.11 MIL/uL   Hemoglobin 13.9 12.0 - 15.0 g/dL   HCT 42.2 36.0 - 46.0 %   MCV 90.0 78.0 - 100.0 fL   MCH 29.6 26.0 - 34.0 pg   MCHC 32.9 30.0 - 36.0 g/dL   RDW 12.8 11.5 - 15.5 %   Platelets 237 150 - 400 K/uL  I-Stat beta hCG blood, ED     Status: None   Collection Time: 04/28/17  7:48 AM  Result Value Ref Range   I-stat hCG, quantitative <5.0 <5 mIU/mL   Comment 3            Comment:   GEST. AGE      CONC.  (mIU/mL)   <=1 WEEK  5 - 50     2 WEEKS       50 - 500     3 WEEKS       100 - 10,000     4 WEEKS     1,000 - 30,000        FEMALE AND NON-PREGNANT FEMALE:     LESS THAN 5 mIU/mL   Urinalysis, Routine w reflex microscopic     Status: Abnormal   Collection Time: 04/28/17  8:00 AM  Result Value Ref Range   Color, Urine YELLOW YELLOW   APPearance CLEAR CLEAR   Specific Gravity, Urine 1.030 1.005 - 1.030   pH 6.0 5.0 - 8.0   Glucose, UA NEGATIVE NEGATIVE mg/dL   Hgb urine dipstick NEGATIVE NEGATIVE   Bilirubin Urine NEGATIVE NEGATIVE   Ketones, ur 5 (A) NEGATIVE mg/dL   Protein, ur NEGATIVE NEGATIVE mg/dL   Nitrite NEGATIVE NEGATIVE   Leukocytes, UA NEGATIVE NEGATIVE    US Abdomen Limited  Result Date: 04/28/2017 CLINICAL DATA:  Epigastric abdominal pain.  Nausea. EXAM: ULTRASOUND ABDOMEN LIMITED RIGHT UPPER QUADRANT COMPARISON:  11/15/2015 FINDINGS: Gallbladder: Several dependent gallstones within the lumen gallbladder measuring up to 1 cm. No gallbladder wall thickening or pericholecystic fluid. Negative sonographic Murphy's sign. Common bile duct: Diameter:  Normal at 3 mm Liver: No focal lesion identified. Within normal limits in parenchymal echogenicity. IMPRESSION: Cholelithiasis without evidence cholecystitis. Electronically Signed   By: Suzy Bouchard M.D.   On: 04/28/2017 09:48    Review of Systems  Constitutional: Negative for weight loss.  HENT: Negative for nosebleeds.   Eyes: Negative for blurred vision.  Respiratory: Negative for shortness of breath.   Cardiovascular: Negative for chest pain, palpitations, orthopnea and PND.       Denies DOE  Gastrointestinal: Positive for abdominal pain, nausea and vomiting. Negative for blood in stool, constipation and diarrhea.  Genitourinary: Negative for dysuria and hematuria.  Musculoskeletal: Negative.   Skin: Negative for itching and rash.  Neurological: Negative for dizziness, focal weakness, seizures, loss of consciousness and headaches.       Denies TIAs, amaurosis fugax  Endo/Heme/Allergies: Does not bruise/bleed easily.  Psychiatric/Behavioral: The patient is not nervous/anxious.    Blood pressure 112/66, pulse (!) 54, temperature 97.7 F (36.5 C), temperature source Oral, resp. rate 16, height 5' 9"  (1.753 m), weight 120.2 kg (265 lb), last menstrual period 04/14/2017, SpO2 98 %. Physical Exam  Vitals reviewed. Constitutional: She is oriented to person, place, and time. She appears well-developed and well-nourished. No distress.  Morbidly obese  HENT:  Head: Normocephalic and atraumatic.  Right Ear: External ear normal.  Left Ear: External ear normal.  Eyes: Conjunctivae are normal. No scleral icterus.  Neck: Normal range of motion. Neck supple. No tracheal deviation present. No thyromegaly present.  Cardiovascular: Normal rate and normal heart sounds.   Respiratory: Effort normal and breath sounds normal. No stridor. No respiratory distress. She has no wheezes.  GI: Soft. She exhibits no distension. There is no tenderness. There is no rigidity, no rebound and no guarding.   Old trocar scars  Musculoskeletal: She exhibits no edema or tenderness.  Lymphadenopathy:    She has no cervical adenopathy.  Neurological: She is alert and oriented to person, place, and time. She exhibits normal muscle tone.  Skin: Skin is warm and dry. No rash noted. She is not diaphoretic. No erythema. No pallor.  Psychiatric: She has a normal mood and affect. Her behavior is normal. Judgment and thought  content normal.    Assessment/Plan: Symptomatic cholelithiasis Morbidly obese Tobacco use  We discussed gallbladder disease. She definitely has symptomatic cholelithiasis. At the current moment I do not think she has acute cholecystitis. I offered her admission with surgery for Monday however the patient states that she had not arrange childcare or arrangements with her work and was asking whether or not this could be arranged as follow-up in the outpatient setting.  I told her while there is no clinical evidence of cholecystitis that I cannot guarantee that she would not have ongoing episodes of gallbladder attacks and/or development cholecystitis. We had a long conversation about the signs and symptoms of cholecystitis. All of her questions were asked and answered. We discussed what to return the emergency room for  Otherwise follow-up with our office early this week to discuss elective cholecystectomy. We also discussed dietary modification.  Discussed with PA  Leighton Ruff. Redmond Pulling, MD, FACS General, Bariatric, & Minimally Invasive Surgery [facility], [place]   [facility] M 04/28/2017, 1:04 PM

## 2017-04-28 NOTE — ED Notes (Signed)
PA Will at the bedside   

## 2017-04-28 NOTE — ED Notes (Signed)
Surgery at the bedside.

## 2017-04-28 NOTE — ED Notes (Signed)
Pt requesting more pain medicine and to be updated.

## 2017-04-28 NOTE — ED Notes (Signed)
PA at the bedside.

## 2017-04-28 NOTE — ED Triage Notes (Signed)
Pt c/o 10/10 epigastric pain since last night getting worse tonight, pt states it feels like something is stock on her upper stomach, pt denies fever or chills, having some nausea and vomiting.

## 2017-04-28 NOTE — ED Notes (Signed)
Will, PA at the bedside.  

## 2017-10-26 IMAGING — CR DG CHEST 2V
2 series · 2 of 2 positions shown · non-contrast
Comparison: None.

CLINICAL DATA: Disease heart palpitations today chest pain

EXAM:
CHEST  2 VIEW

[w chest lat]
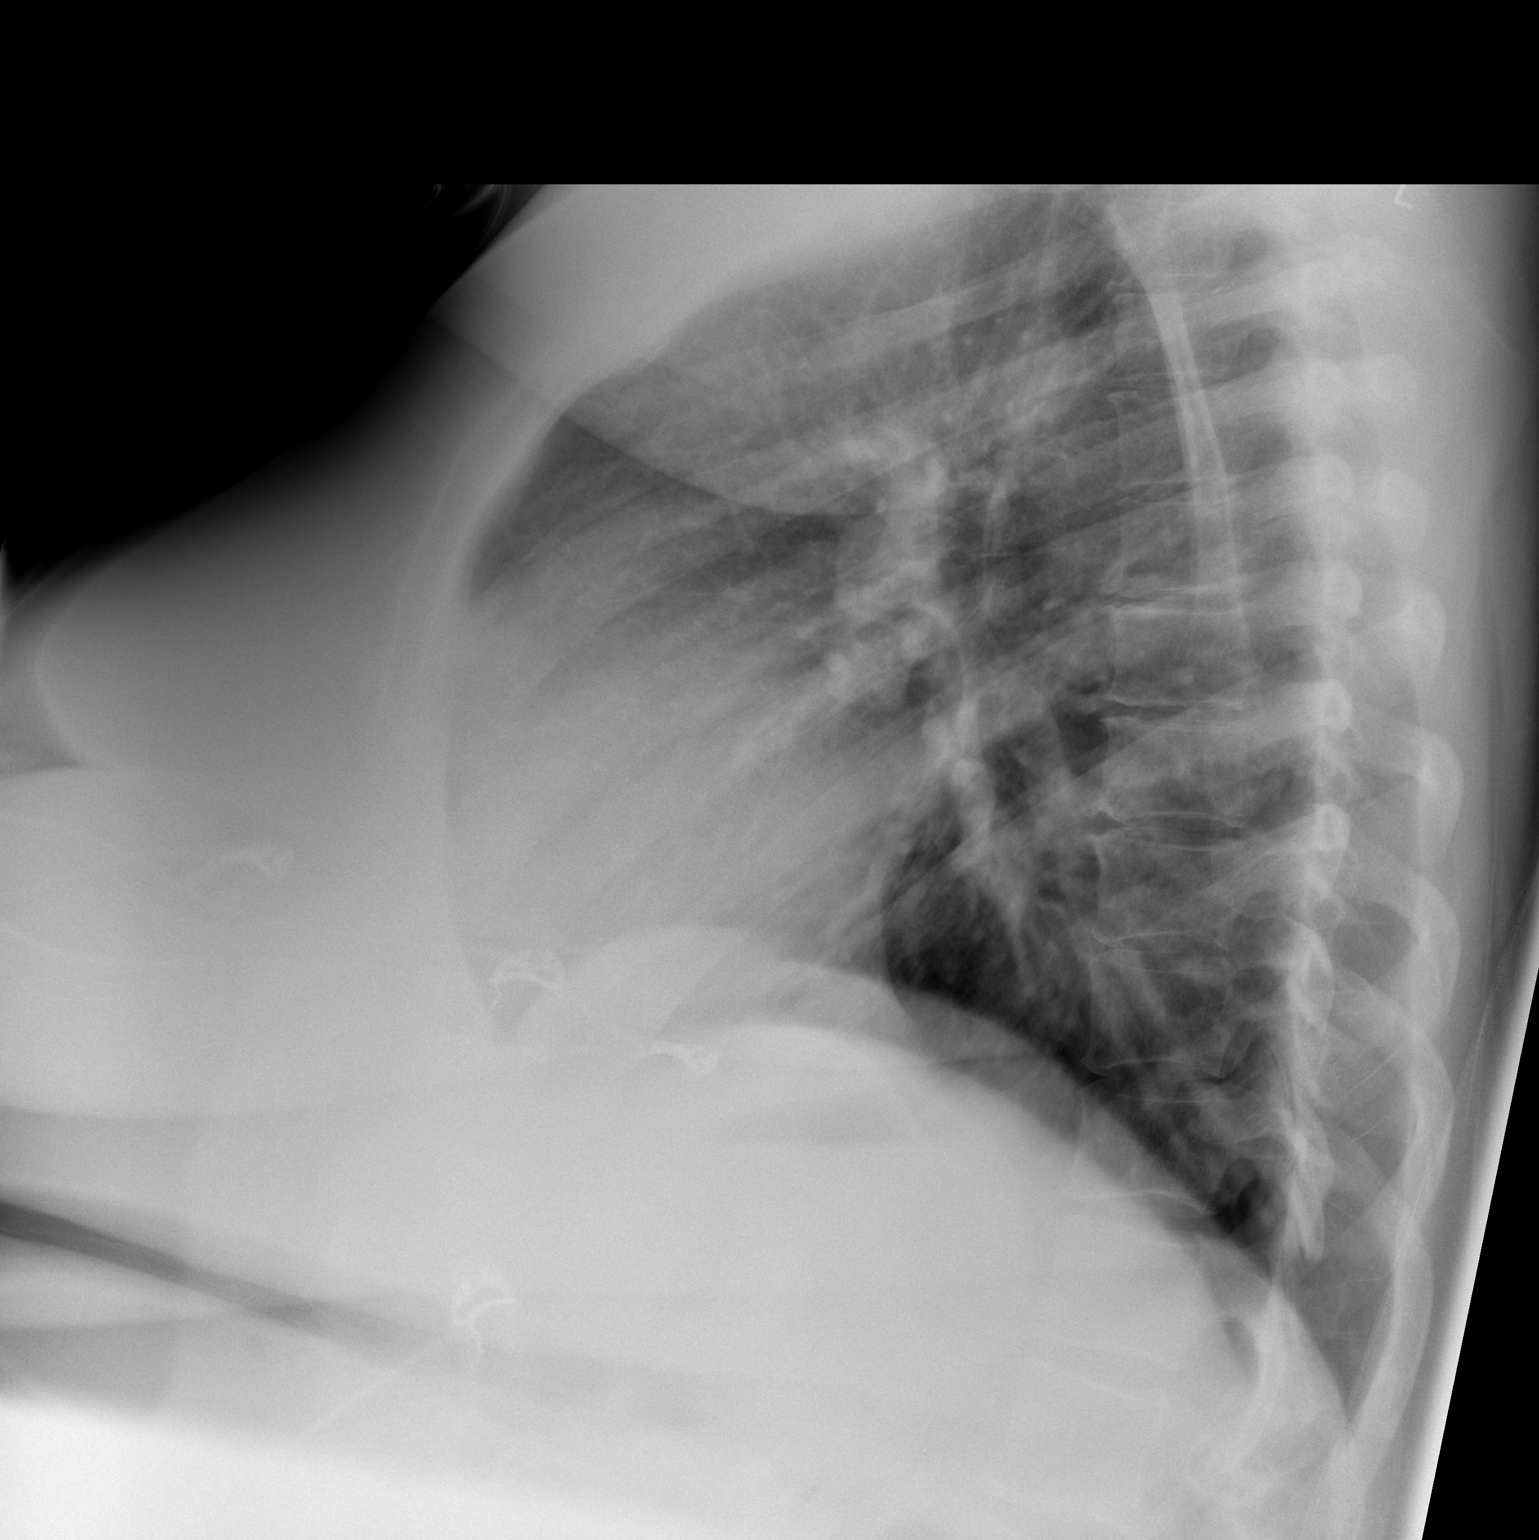

[w chest pa]
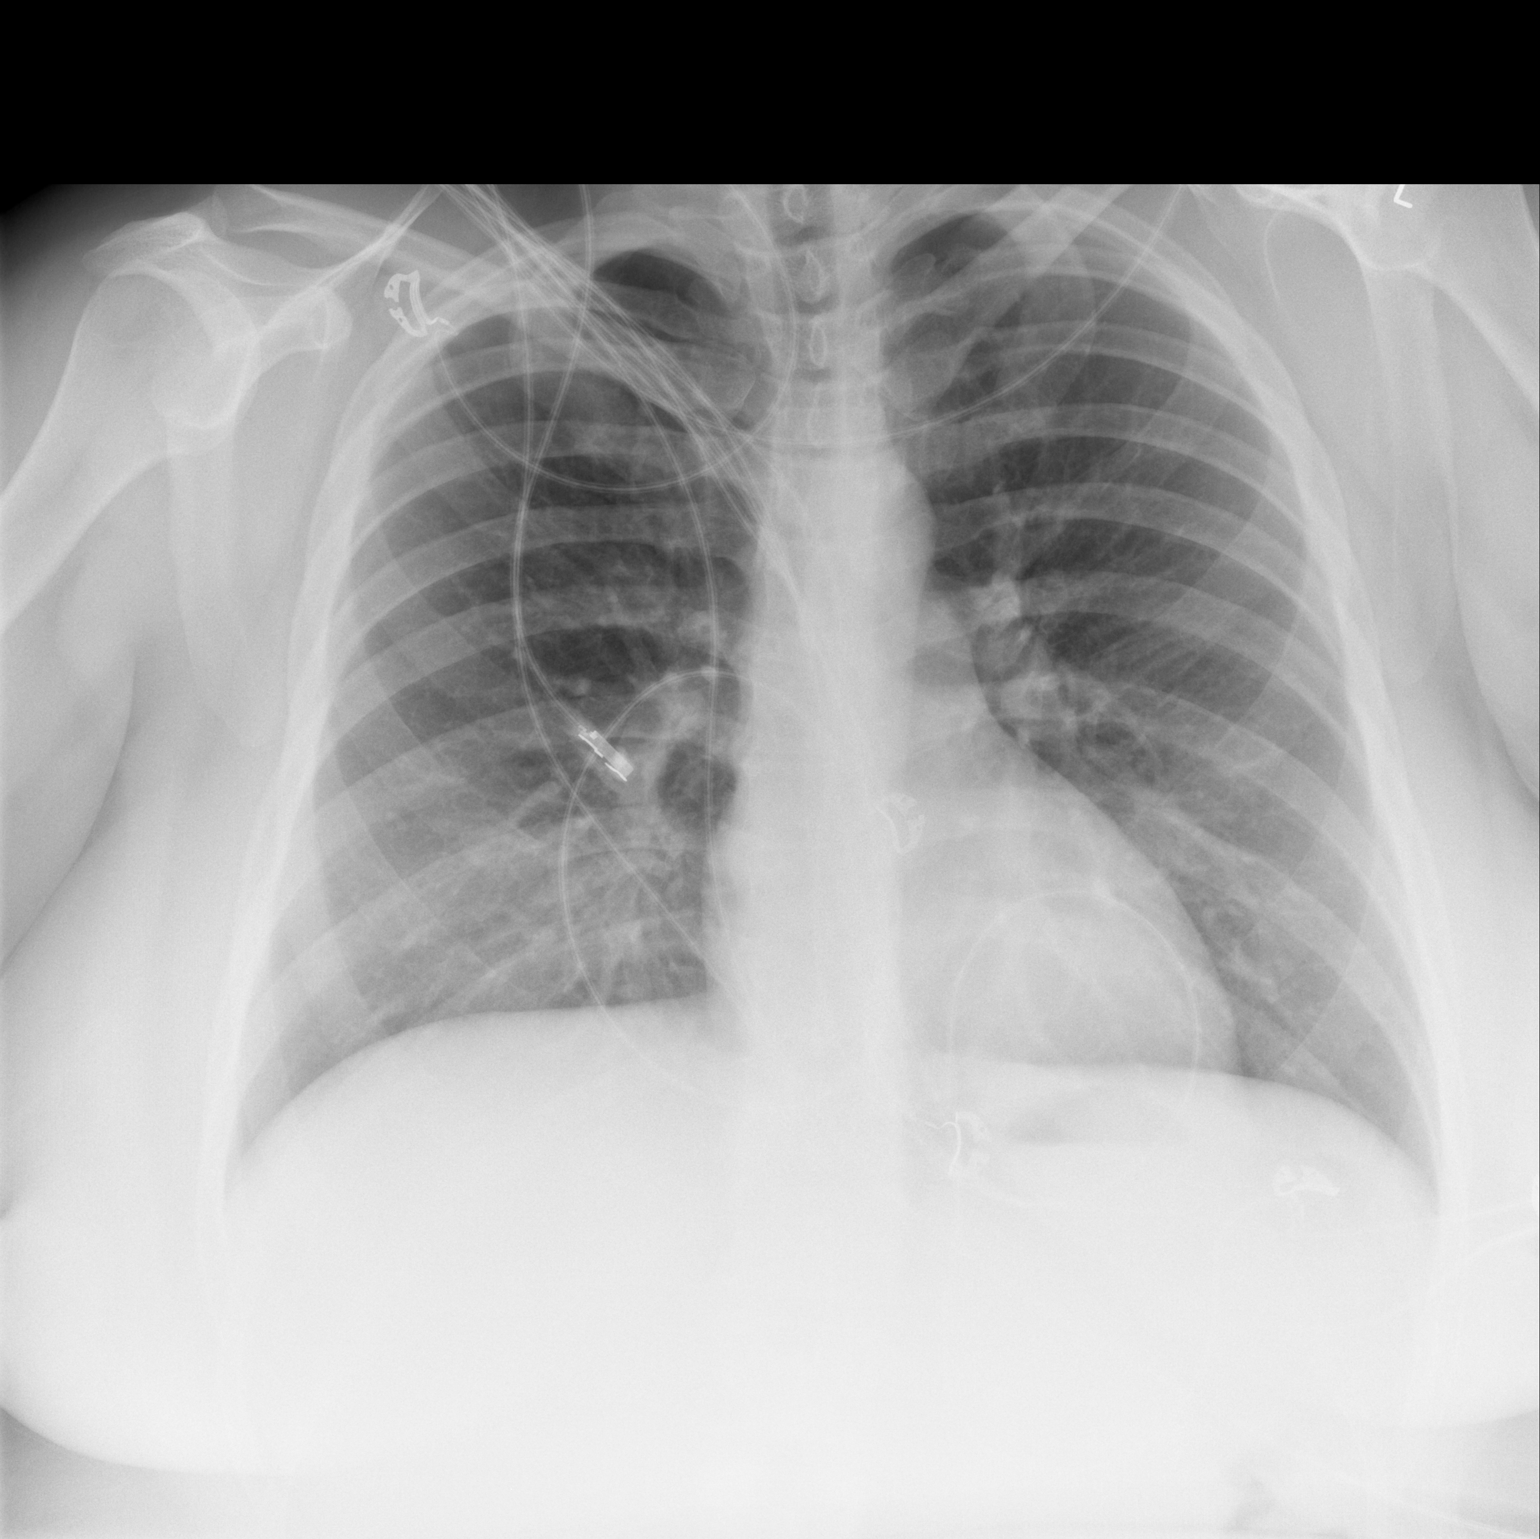

[2 of 2 positions shown; findings below may reference images not displayed]

FINDINGS: The heart size and mediastinal contours are within normal limits.
Both lungs are clear. The visualized skeletal structures are
unremarkable.
IMPRESSION: No active cardiopulmonary disease.

## 2018-05-04 ENCOUNTER — Other Ambulatory Visit: Payer: Self-pay

## 2018-05-04 ENCOUNTER — Emergency Department (HOSPITAL_BASED_OUTPATIENT_CLINIC_OR_DEPARTMENT_OTHER)
Admission: EM | Admit: 2018-05-04 | Discharge: 2018-05-04 | Disposition: A | Discharge status: Home / Self Care | Payer: PRIVATE HEALTH INSURANCE | Attending: Emergency Medicine | Admitting: Emergency Medicine

## 2018-05-04 ENCOUNTER — Encounter (HOSPITAL_BASED_OUTPATIENT_CLINIC_OR_DEPARTMENT_OTHER): Payer: Self-pay | Admitting: Emergency Medicine

## 2018-05-04 DIAGNOSIS — M5410 Radiculopathy, site unspecified: Secondary | ICD-10-CM | POA: Insufficient documentation

## 2018-05-04 DIAGNOSIS — Z79899 Other long term (current) drug therapy: Secondary | ICD-10-CM | POA: Insufficient documentation

## 2018-05-04 DIAGNOSIS — N939 Abnormal uterine and vaginal bleeding, unspecified: Secondary | ICD-10-CM

## 2018-05-04 DIAGNOSIS — F17210 Nicotine dependence, cigarettes, uncomplicated: Secondary | ICD-10-CM | POA: Insufficient documentation

## 2018-05-04 LAB — URINALYSIS, ROUTINE W REFLEX MICROSCOPIC
BILIRUBIN URINE: NEGATIVE
Glucose, UA: NEGATIVE mg/dL
Ketones, ur: NEGATIVE mg/dL
Leukocytes, UA: NEGATIVE
NITRITE: NEGATIVE
PH: 7 (ref 5.0–8.0)
Protein, ur: NEGATIVE mg/dL
SPECIFIC GRAVITY, URINE: 1.015 (ref 1.005–1.030)

## 2018-05-04 LAB — CBC WITH DIFFERENTIAL/PLATELET
BASOS PCT: 0 %
Basophils Absolute: 0 10*3/uL (ref 0.0–0.1)
EOS ABS: 0.1 10*3/uL (ref 0.0–0.7)
Eosinophils Relative: 1 %
HCT: 40.5 % (ref 36.0–46.0)
Hemoglobin: 13.7 g/dL (ref 12.0–15.0)
Lymphocytes Relative: 34 %
Lymphs Abs: 2.3 10*3/uL (ref 0.7–4.0)
MCH: 30.3 pg (ref 26.0–34.0)
MCHC: 33.8 g/dL (ref 30.0–36.0)
MCV: 89.6 fL (ref 78.0–100.0)
MONO ABS: 0.6 10*3/uL (ref 0.1–1.0)
MONOS PCT: 9 %
NEUTROS PCT: 56 %
Neutro Abs: 3.7 10*3/uL (ref 1.7–7.7)
Platelets: 228 10*3/uL (ref 150–400)
RBC: 4.52 MIL/uL (ref 3.87–5.11)
RDW: 12.6 % (ref 11.5–15.5)
WBC: 6.7 10*3/uL (ref 4.0–10.5)

## 2018-05-04 LAB — URINALYSIS, MICROSCOPIC (REFLEX)

## 2018-05-04 LAB — PREGNANCY, URINE: Preg Test, Ur: NEGATIVE

## 2018-05-04 NOTE — ED Provider Notes (Signed)
[facility] EMERGENCY DEPARTMENT Provider Note   CSN: [id number] Arrival date & time: 05/04/18  1747     History   Chief Complaint Chief Complaint  Patient presents with  . Vaginal Bleeding    HPI Natalie Gilmore is a 32 y.o. female.  Patient is a 32 year old female who presents with vaginal bleeding.  She states she started spotting on June 20 and over the last few days is gotten heavier and she is passing some clots.  She denies any abdominal pain.  Her last menstrual period was May 25.  She denies any dizziness or shortness of breath.  She denies any urinary symptoms.  She does say that she is also having a 2-day history of some pain in her left lower back that radiates to her left buttocks.  She has occasional tingling in her left leg but it is in the leg.  No loss of bowel or bladder control.  No history of trauma to the back.  No history of similar symptoms in the past.  She states it hurts to walk.     History reviewed. No pertinent past medical history.  There are no active problems to display for this patient.   Past Surgical History:  Procedure Laterality Date  . APPENDECTOMY    . DILATION AND CURETTAGE OF UTERUS       OB History   None      Home Medications    Prior to Admission medications   Medication Sig Start Date End Date Taking? Authorizing Provider  HYDROcodone-acetaminophen (NORCO/VICODIN) 5-325 MG tablet Take 1-2 tablets by mouth every 6 (six) hours as needed for moderate pain or severe pain. 04/28/17   Everlene Farrier, PA-C  Multiple Vitamin (MULTIVITAMIN WITH MINERALS) TABS tablet Take 1 tablet by mouth daily.    [provider]  omeprazole (PRILOSEC) 20 MG capsule Take 1 capsule (20 mg total) by mouth daily. 04/28/17   Everlene Farrier, PA-C  ondansetron (ZOFRAN ODT) 4 MG disintegrating tablet Take 1 tablet (4 mg total) by mouth every 8 (eight) hours as needed for nausea or vomiting. 04/28/17   Everlene Farrier, PA-C    Family  History History reviewed. No pertinent family history.  Social History Social History   Tobacco Use  . Smoking status: Current Every Day Smoker    Packs/day: 0.50    Types: Cigarettes  . Smokeless tobacco: Never Used  Substance Use Topics  . Alcohol use: Yes    Comment: occ  . Drug use: No     Allergies   Patient has no known allergies.   Review of Systems Review of Systems  Constitutional: Negative for chills, diaphoresis, fatigue and fever.  HENT: Negative for congestion, rhinorrhea and sneezing.   Eyes: Negative.   Respiratory: Negative for cough, chest tightness and shortness of breath.   Cardiovascular: Negative for chest pain and leg swelling.  Gastrointestinal: Negative for abdominal pain, blood in stool, diarrhea, nausea and vomiting.  Genitourinary: Positive for vaginal bleeding. Negative for difficulty urinating, flank pain, frequency and hematuria.  Musculoskeletal: Positive for back pain. Negative for arthralgias.  Skin: Negative for rash.  Neurological: Negative for dizziness, speech difficulty, weakness, numbness and headaches.     Physical Exam Updated Vital Signs BP (!) 153/85 (BP Location: Left Arm)   Pulse 73   Temp 98.2 F (36.8 C) (Oral)   Resp 20   Ht 5\' 9"  (1.753 m)   Wt 130.6 kg (288 lb)   LMP 05/04/2018   SpO2 99%  BMI 42.53 kg/m   Physical Exam  Constitutional: She is oriented to person, place, and time. She appears well-developed and well-nourished.  HENT:  Head: Normocephalic and atraumatic.  Eyes: Pupils are equal, round, and reactive to light.  Neck: Normal range of motion. Neck supple.  Cardiovascular: Normal rate, regular rhythm and normal heart sounds.  Pulmonary/Chest: Effort normal and breath sounds normal. No respiratory distress. She has no wheezes. She has no rales. She exhibits no tenderness.  Abdominal: Soft. Bowel sounds are normal. There is no tenderness. There is no rebound and no guarding.  Genitourinary:    Genitourinary Comments: Patient has scant amount of dark blood in the vault, no active bleeding, no discharge, no cervical motion tenderness or adnexal tenderness  Musculoskeletal: Normal range of motion. She exhibits no edema.  Patient has tenderness to the left lumbar paraspinal area and a little bit over the left sciatic nerve.  Negative straight leg raise bilaterally.  She has normal motor function and sensation to light touch in the extremities bilaterally.  Pedal pulses are intact.  Lymphadenopathy:    She has no cervical adenopathy.  Neurological: She is alert and oriented to person, place, and time.  Skin: Skin is warm and dry. No rash noted.  Psychiatric: She has a normal mood and affect.     ED Treatments / Results  Labs (all labs ordered are listed, but only abnormal results are displayed) Labs Reviewed  URINALYSIS, ROUTINE W REFLEX MICROSCOPIC - Abnormal; Notable for the following components:      Result Value   Hgb urine dipstick TRACE (*)    All other components within normal limits  URINALYSIS, MICROSCOPIC (REFLEX) - Abnormal; Notable for the following components:   Bacteria, UA MANY (*)    All other components within normal limits  PREGNANCY, URINE  CBC WITH DIFFERENTIAL/PLATELET    EKG None  Radiology No results found.  Procedures Procedures (including critical care time)  Medications Ordered in ED Medications - No data to display   Initial Impression / Assessment and Plan / ED Course  I have reviewed the triage vital signs and the nursing notes.  Pertinent labs & imaging results that were available during my care of the patient were reviewed by me and considered in my medical decision making (see chart for details).     Patient is a 32 year old female who presents with vaginal bleeding.  She was concerned about a miscarriage but her pregnancy test is negative.  She has no active bleeding on exam.  Her hemoglobin is normal.  She also has some radicular  low back pain.  There is no bony tenderness.  No recent injuries.  No indications for imaging studies currently.  No neurologic deficits or signs of cauda equina.  She was discharged home in good condition.  She was offered prescriptions for medication to use for her back but she denies this and states she will just use ibuprofen.  She does not want to try steroids.  I gave her referrals to [facility] as well as an orthopedist for her back.  Return precautions were given.  Final Clinical Impressions(s) / ED Diagnoses   Final diagnoses:  Vaginal bleeding  Radicular low back pain    ED Discharge Orders    None       Rolan BuccoBelfi, Alyn Riedinger, MD 05/04/18 [phone]

## 2018-05-04 NOTE — ED Triage Notes (Signed)
Patient states that she has pain to her left sciatic nerve area x 2 days. The patient states that she also is having large amount of vaginal bleeding x 1 week

## 2018-09-12 IMAGING — US US ABDOMEN LIMITED
1 series · 14 of 25 positions shown · non-contrast
Comparison: 11/15/2015

CLINICAL DATA: Epigastric abdominal pain.  Nausea.

EXAM:
ULTRASOUND ABDOMEN LIMITED RIGHT UPPER QUADRANT

[Series 1: us abdomen limited · 0.25mm/px · 14 of 25 slices shown]
[im 1/25]
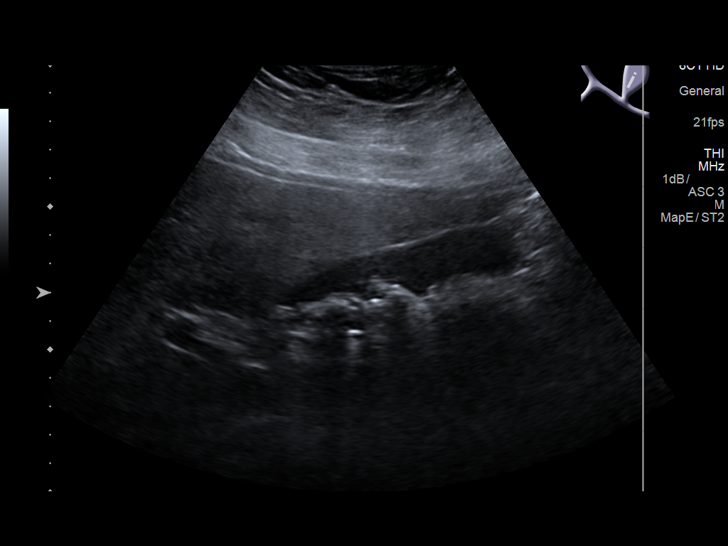
[im 3/25]
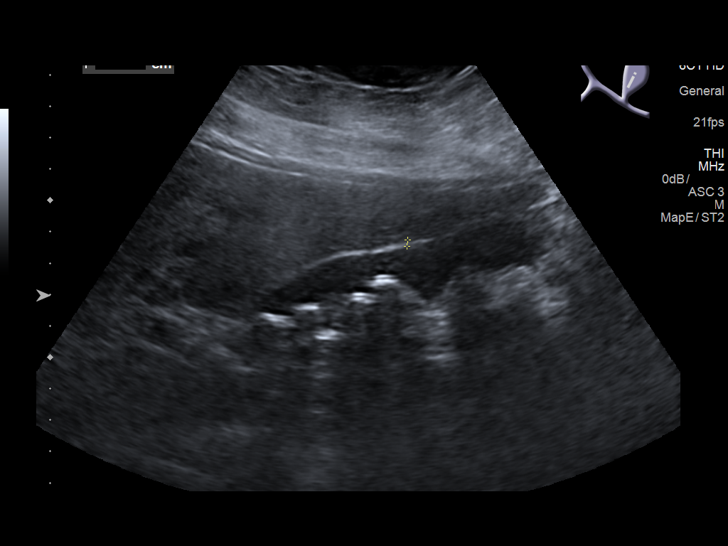
[im 5/25]
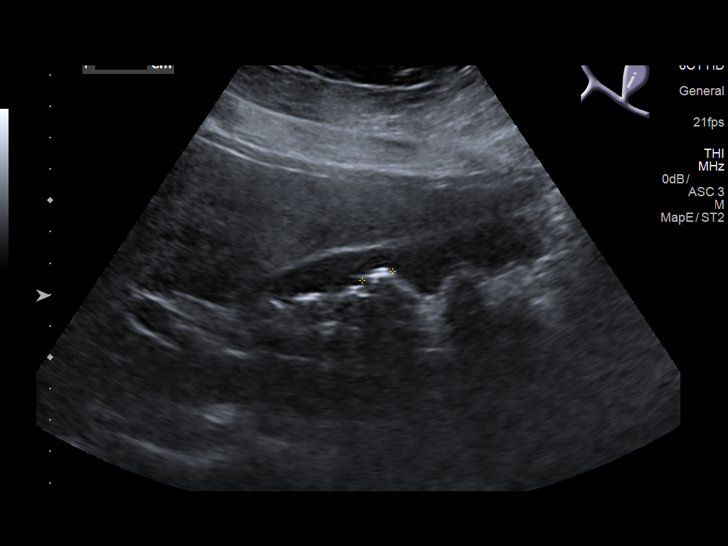
[im 7/25]
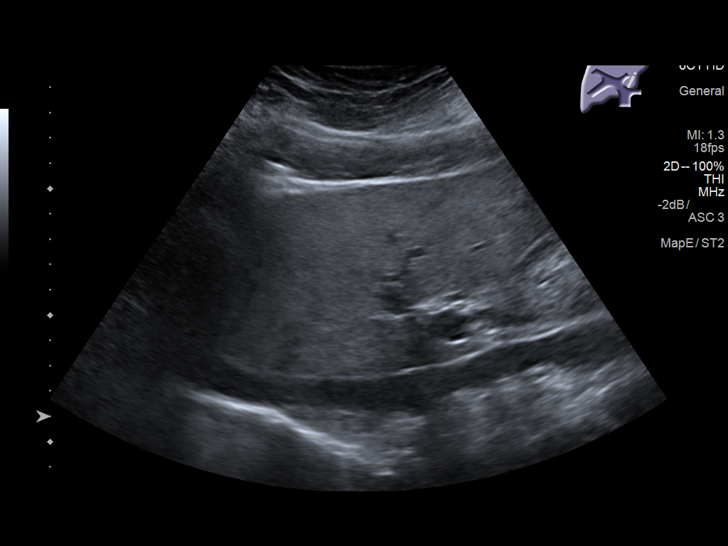
[im 9/25]
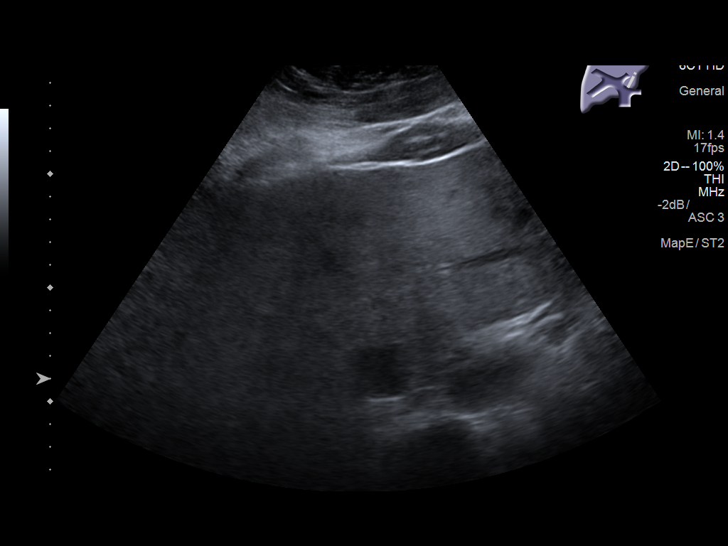
[im 10/25]
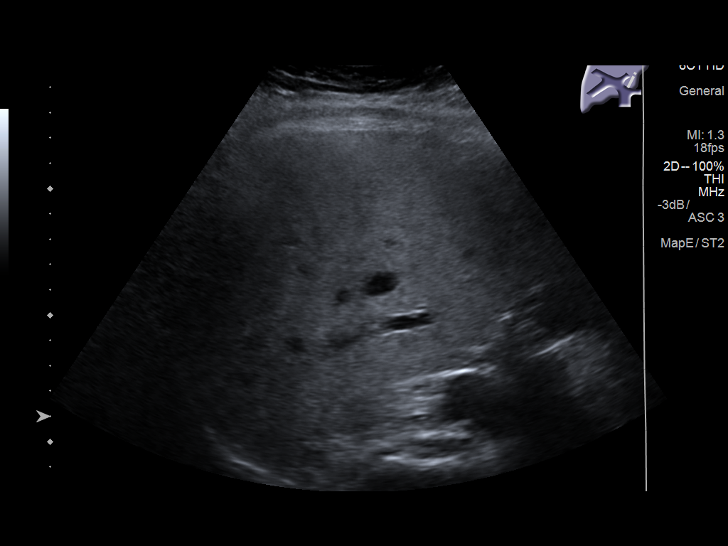
[im 12/25]
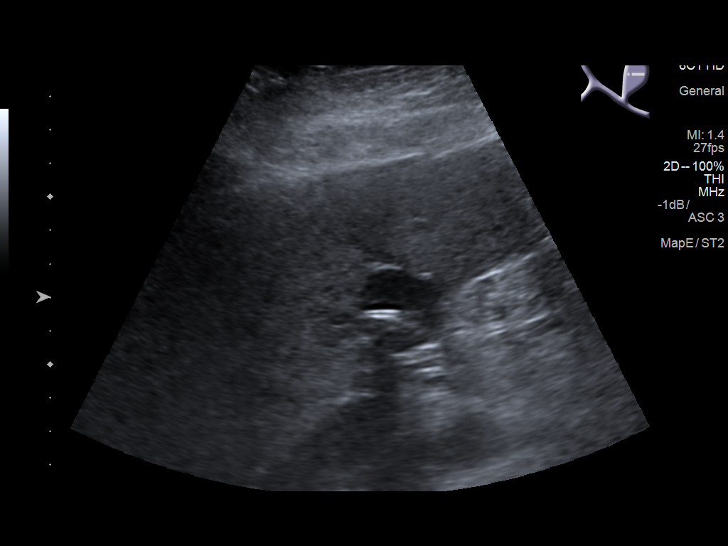
[im 14/25]
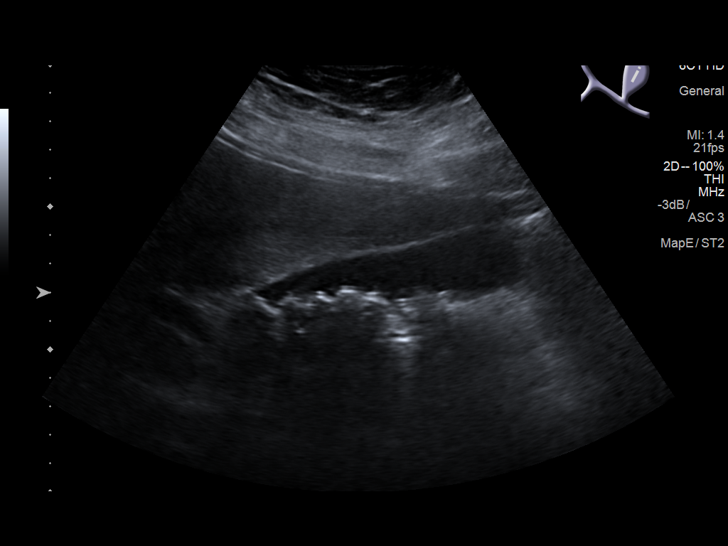
[im 16/25]
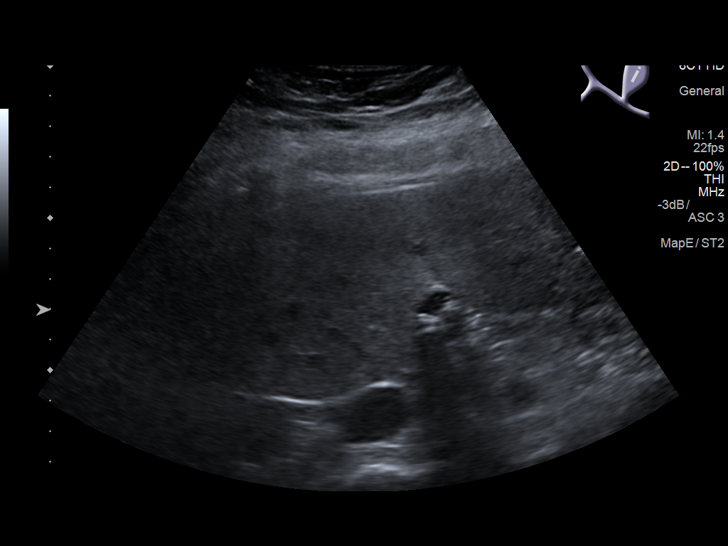
[im 17/25]
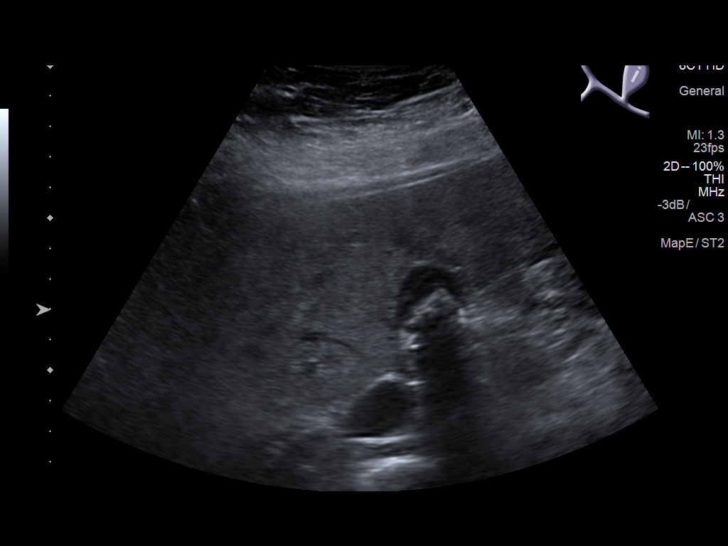
[im 19/25]
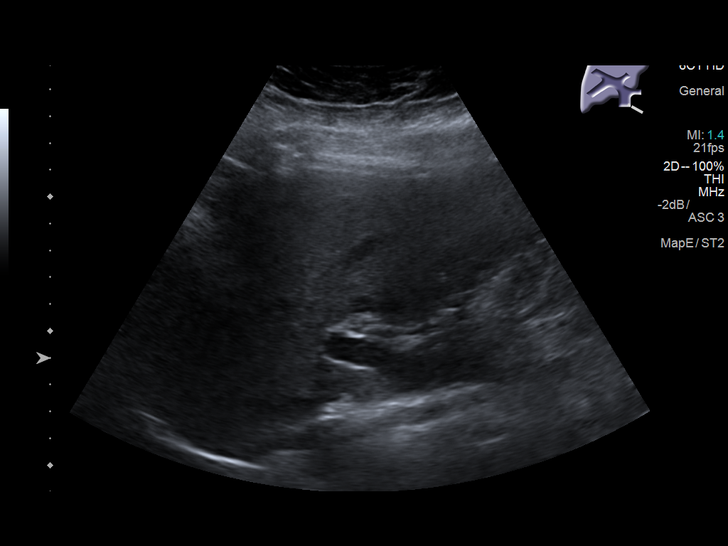
[im 21/25]
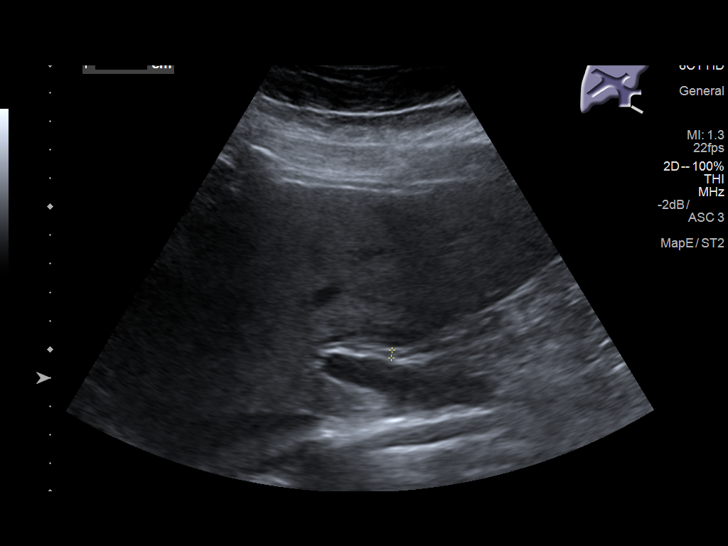
[im 23/25]
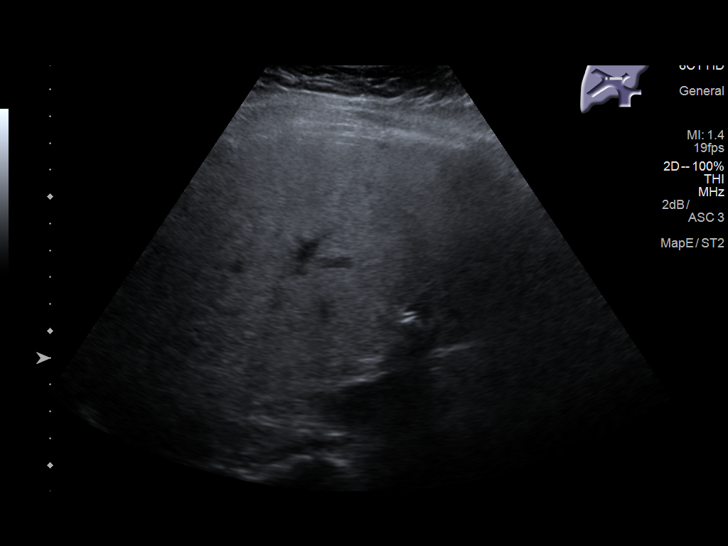
[im 25/25]
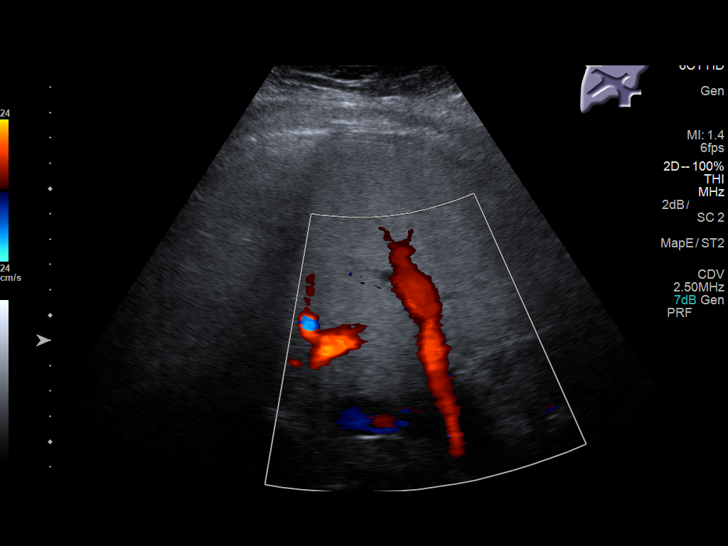

[14 of 25 positions shown; findings below may reference images not displayed]

FINDINGS: Gallbladder:

Several dependent gallstones within the lumen gallbladder measuring
up to 1 cm. No gallbladder wall thickening or pericholecystic fluid.
Negative sonographic Murphy's sign.

Common bile duct:

Diameter: Normal at 3 mm

Liver:

No focal lesion identified. Within normal limits in parenchymal
echogenicity.
IMPRESSION: Cholelithiasis without evidence cholecystitis.

## 2018-09-28 ENCOUNTER — Emergency Department (HOSPITAL_BASED_OUTPATIENT_CLINIC_OR_DEPARTMENT_OTHER)
Admission: EM | Admit: 2018-09-28 | Discharge: 2018-09-28 | Disposition: A | Discharge status: Home / Self Care | Payer: PRIVATE HEALTH INSURANCE | Attending: Emergency Medicine | Admitting: Emergency Medicine

## 2018-09-28 ENCOUNTER — Encounter (HOSPITAL_BASED_OUTPATIENT_CLINIC_OR_DEPARTMENT_OTHER): Payer: Self-pay | Admitting: *Deleted

## 2018-09-28 ENCOUNTER — Other Ambulatory Visit: Payer: Self-pay

## 2018-09-28 DIAGNOSIS — B9789 Other viral agents as the cause of diseases classified elsewhere: Secondary | ICD-10-CM | POA: Insufficient documentation

## 2018-09-28 DIAGNOSIS — Z87891 Personal history of nicotine dependence: Secondary | ICD-10-CM | POA: Insufficient documentation

## 2018-09-28 DIAGNOSIS — N939 Abnormal uterine and vaginal bleeding, unspecified: Secondary | ICD-10-CM | POA: Insufficient documentation

## 2018-09-28 DIAGNOSIS — J029 Acute pharyngitis, unspecified: Secondary | ICD-10-CM

## 2018-09-28 LAB — GROUP A STREP BY PCR: GROUP A STREP BY PCR: NOT DETECTED

## 2018-09-28 LAB — COMPREHENSIVE METABOLIC PANEL
ALBUMIN: 4 g/dL (ref 3.5–5.0)
ALT: 25 U/L (ref 0–44)
ANION GAP: 9 (ref 5–15)
AST: 24 U/L (ref 15–41)
Alkaline Phosphatase: 61 U/L (ref 38–126)
BUN: 10 mg/dL (ref 6–20)
CHLORIDE: 105 mmol/L (ref 98–111)
CO2: 23 mmol/L (ref 22–32)
Calcium: 9 mg/dL (ref 8.9–10.3)
Creatinine, Ser: 0.71 mg/dL (ref 0.44–1.00)
GFR calc Af Amer: >60 mL/min (ref >60)
Glucose, Bld: 120 mg/dL — ABNORMAL HIGH (ref 70–99)
POTASSIUM: 3.8 mmol/L (ref 3.5–5.1)
Sodium: 137 mmol/L (ref 135–145)
TOTAL PROTEIN: 7.9 g/dL (ref 6.5–8.1)
Total Bilirubin: 0.4 mg/dL (ref 0.3–1.2)

## 2018-09-28 LAB — CBC WITH DIFFERENTIAL/PLATELET
Abs Immature Granulocytes: 0.02 10*3/uL (ref 0.00–0.07)
BASOS ABS: 0 10*3/uL (ref 0.0–0.1)
Basophils Relative: 0 %
Eosinophils Absolute: 0.1 10*3/uL (ref 0.0–0.5)
Eosinophils Relative: 2 %
HEMATOCRIT: 44.4 % (ref 36.0–46.0)
HEMOGLOBIN: 14.2 g/dL (ref 12.0–15.0)
IMMATURE GRANULOCYTES: 0 %
LYMPHS ABS: 3.4 10*3/uL (ref 0.7–4.0)
LYMPHS PCT: 48 %
MCH: 29.7 pg (ref 26.0–34.0)
MCHC: 32 g/dL (ref 30.0–36.0)
MCV: 92.9 fL (ref 80.0–100.0)
Monocytes Absolute: 0.4 10*3/uL (ref 0.1–1.0)
Monocytes Relative: 6 %
NEUTROS ABS: 3.1 10*3/uL (ref 1.7–7.7)
NEUTROS PCT: 44 %
NRBC: 0 % (ref 0.0–0.2)
Platelets: 255 10*3/uL (ref 150–400)
RBC: 4.78 MIL/uL (ref 3.87–5.11)
RDW: 12.2 % (ref 11.5–15.5)
WBC: 7.1 10*3/uL (ref 4.0–10.5)

## 2018-09-28 LAB — PREGNANCY, URINE: Preg Test, Ur: NEGATIVE

## 2018-09-28 MED ORDER — SODIUM CHLORIDE 0.9 % IV BOLUS
1000.0000 mL | Freq: Once | INTRAVENOUS | Status: AC
  Administered 2018-09-28: 1000 mL via INTRAVENOUS

## 2018-09-28 MED ORDER — LIDOCAINE VISCOUS HCL 2 % MT SOLN: 15.0000 mL | OROMUCOSAL | 0 refills | Status: DC | PRN

## 2018-09-28 NOTE — ED Provider Notes (Signed)
[facility] EMERGENCY DEPARTMENT Provider Note   CSN: [id number] Arrival date & time: 09/28/18  2044     History   Chief Complaint Chief Complaint  Patient presents with  . Vaginal Bleeding    HPI Natalie Gilmore is a 32 y.o. female presenting with her husband today for increased vaginal bleeding.  Patient states that she has had heavy vaginal bleeding that has been persistent for approximately 5 months.  Patient states that she bleeds for 2-3 weeks at a time with 1 week without bleeding in between.  Patient endorses mild menstrual cramping only present with her bleeding.  Patient denies vaginal discharge, dysuria, hematuria.  Patient states that she is only sexually active with one partner and is not concerned for STDs today and does not wish to be tested.  Patient states that she has not followed up with OB/GYN regarding her vaginal bleeding.  Additionally patient states that she has had a sore throat for 1 day with subjective fever.  Denies trouble swallowing/breathing, patient eating and drinking without difficulty.  Patient describes her sore throat as a mild burning sensation that is constant and worse with swallowing.  Patient has not taken medication for pain. HPI  History reviewed. No pertinent past medical history.  There are no active problems to display for this patient.   Past Surgical History:  Procedure Laterality Date  . APPENDECTOMY    . DILATION AND CURETTAGE OF UTERUS       OB History   None      Home Medications    Prior to Admission medications   Medication Sig Start Date End Date Taking? Authorizing Provider  HYDROcodone-acetaminophen (NORCO/VICODIN) 5-325 MG tablet Take 1-2 tablets by mouth every 6 (six) hours as needed for moderate pain or severe pain. 04/28/17   Everlene Farrieransie, William, PA-C  lidocaine (XYLOCAINE) 2 % solution Use as directed 15 mLs in the mouth or throat as needed for mouth pain (Do not swallow). 09/28/18   Harlene SaltsMorelli, Jametta Moorehead A,  PA-C  Multiple Vitamin (MULTIVITAMIN WITH MINERALS) TABS tablet Take 1 tablet by mouth daily.    [provider]  omeprazole (PRILOSEC) 20 MG capsule Take 1 capsule (20 mg total) by mouth daily. 04/28/17   Everlene Farrieransie, William, PA-C  ondansetron (ZOFRAN ODT) 4 MG disintegrating tablet Take 1 tablet (4 mg total) by mouth every 8 (eight) hours as needed for nausea or vomiting. 04/28/17   Everlene Farrieransie, William, PA-C    Family History No family history on file.  Social History Social History   Tobacco Use  . Smoking status: Former Smoker    Packs/day: 0.50    Types: Cigarettes  . Smokeless tobacco: Never Used  Substance Use Topics  . Alcohol use: Yes    Comment: occ  . Drug use: No     Allergies   Patient has no known allergies.   Review of Systems Review of Systems  Constitutional: Negative.  Negative for chills and fever.  HENT: Positive for sore throat. Negative for congestion, facial swelling, rhinorrhea, trouble swallowing and voice change.   Eyes: Negative.  Negative for visual disturbance.  Respiratory: Negative.  Negative for cough and shortness of breath.   Cardiovascular: Negative.  Negative for chest pain.  Gastrointestinal: Negative.  Negative for abdominal pain, diarrhea, nausea and vomiting.  Genitourinary: Positive for menstrual problem and vaginal bleeding. Negative for difficulty urinating, dysuria, flank pain, hematuria, vaginal discharge and vaginal pain.  Musculoskeletal: Negative.  Negative for arthralgias and myalgias.  Skin: Negative.  Negative  for rash.  Neurological: Negative.  Negative for dizziness, weakness, light-headedness and headaches.   Physical Exam Updated Vital Signs BP (!) 145/85   Pulse 82   Temp 98.2 F (36.8 C)   Resp 18   LMP 09/07/2018   SpO2 96%   Physical Exam  Constitutional: She appears well-developed and well-nourished. No distress.  HENT:  Head: Normocephalic and atraumatic.  Right Ear: External ear normal.  Left Ear:  External ear normal.  Nose: Nose normal.  Mouth/Throat: Uvula is midline, oropharynx is clear and moist and mucous membranes are normal.  The patient has normal phonation and is in control of secretions. No stridor.  Midline uvula without edema. Soft palate rises symmetrically. Mild tonsillar erythema, no swelling or exudates. Tongue protrusion is normal, floor of mouth is soft. No trismus. No creptius on neck palpation. No gingival erythema or fluctuance noted. Mucus membranes moist. No pallor noted.  Eyes: Pupils are equal, round, and reactive to light. EOM are normal.  Neck: Trachea normal and normal range of motion. No tracheal deviation present.  Cardiovascular: Normal rate, regular rhythm, normal heart sounds and intact distal pulses.  Pulses:      Dorsalis pedis pulses are 2+ on the right side, and 2+ on the left side.       Posterior tibial pulses are 2+ on the right side, and 2+ on the left side.  Pulmonary/Chest: Effort normal and breath sounds normal. No respiratory distress. She has no wheezes.  Abdominal: Soft. There is no tenderness. There is no rigidity, no rebound, no guarding, no CVA tenderness, no tenderness at McBurney's point and negative Murphy's sign.  Obese abdomen  Genitourinary:  Genitourinary Comments: Deferred by patient.  Musculoskeletal: Normal range of motion.       Right lower leg: Normal.       Left lower leg: Normal.  Feet:  Right Foot:  Protective Sensation: 3 sites tested. 3 sites sensed.  Left Foot:  Protective Sensation: 3 sites tested. 3 sites sensed.  Neurological: She is alert. GCS eye subscore is 4. GCS verbal subscore is 5. GCS motor subscore is 6.  Speech is clear and goal oriented, follows commands Major Cranial nerves without deficit, no facial droop Moves extremities without ataxia, coordination intact Normal gait  Skin: Skin is warm and dry. Capillary refill takes less than 2 seconds.  Psychiatric: She has a normal mood and affect. Her  behavior is normal.   ED Treatments / Results  Labs (all labs ordered are listed, but only abnormal results are displayed) Labs Reviewed  COMPREHENSIVE METABOLIC PANEL - Abnormal; Notable for the following components:      Result Value   Glucose, Bld 120 (*)    All other components within normal limits  GROUP A STREP BY PCR  PREGNANCY, URINE  CBC WITH DIFFERENTIAL/PLATELET    EKG None  Radiology No results found.  Procedures Procedures (including critical care time)  Medications Ordered in ED Medications  sodium chloride 0.9 % bolus 1,000 mL ( Intravenous Stopped 09/28/18 2323)     Initial Impression / Assessment and Plan / ED Course  I have reviewed the triage vital signs and the nursing notes.  Pertinent labs & imaging results that were available during my care of the patient were reviewed by me and considered in my medical decision making (see chart for details).    32 year old female presenting for vaginal bleeding that has been vaginal bleeding that has been heavy and intermittent for the last 5 months.  CBC within normal limits CMP nonacute Urine pregnancy negative  Pelvic exam refused by patient today, she states that she wishes to follow-up with women's clinic for further evaluation of her vaginal bleeding.  Patient without return of vaginal discharge, concern for STD, dysuria, rash or systemic symptoms.  Patient informed that she may return to emergency department to have the same done anytime if she changes her mind.  Patient was offered oral contraceptive pills today to help with vaginal bleeding however she refused stating that she and her husband at bedside are trying to conceive.  Additionally patient with sore throat today x1 day.  Patient is afebrile without tonsillar exudate, negative strep. Presents with mild cervical lymphadenopathy & dysphagia; diagnosis of viral pharyngitis. No antibiotics indicated at this time.  Patient does not appear dehydrated,  but did discuss importance of water rehydration. Presentation non-concerning for peritonsillar abscess, retropharyngeal abscess, Ludwig's angina or other deep tissue infections of the head/neck. Patient is without trismus or uvula deviation.  Patient is able to drink water in ED without difficulty with intact air way.  Patient prescribed viscous lidocaine mouth rinse for comfort.  Patient afebrile, not tachycardic, not hypotensive, well-appearing and in no acute distress.  At this time there does not appear to be any evidence of an acute emergency medical condition and the patient appears stable for discharge with appropriate outpatient follow up. Diagnosis was discussed with patient who verbalizes understanding of care plan and is agreeable to discharge. I have discussed return precautions with patient and husband at bedside who verbalize understanding of return precautions. Patient strongly encouraged to follow-up with their PCP. All questions answered.  Patient's case discussed with Dr. Silverio Lay who agrees with plan to discharge with follow-up.   Note: Portions of this report may have been transcribed using voice recognition software. Every effort was made to ensure accuracy; however, inadvertent computerized transcription errors may still be present. Final Clinical Impressions(s) / ED Diagnoses   Final diagnoses:  Vaginal bleeding  Viral pharyngitis    ED Discharge Orders         Ordered    lidocaine (XYLOCAINE) 2 % solution  As needed     09/28/18 2343           Bill Salinas, PA-C 09/28/18 2351    Charlynne Pander, MD 09/30/18 1150

## 2018-09-28 NOTE — Discharge Instructions (Signed)
You have been diagnosed today with vaginal bleeding and viral pharyngitis.  At this time there does not appear to be the presence of an emergent medical condition, however there is always the potential for conditions to change. Please read and follow the below instructions.  Please return to the Emergency Department immediately for any new or worsening symptoms Please be sure to follow up with your Primary Care Provider within 1 week regarding your visit today; please call their office to schedule an appointment even if you are feeling better for a follow-up visit. Please follow-up with the women's clinic regarding your vaginal bleeding.  Please call their office tomorrow to schedule an appointment for this week.   Your strep test was negative for bacteria today.  You may use the viscous lidocaine as prescribed to help with your sore throat.  Please drink plenty of water and get plenty of rest to help with your symptoms.  Do not swallow the viscous lidocaine.  Return to the emergency department if your symptoms do not improve within 3 days.  Get help right away if: You pass out. You are changing pads every 15 to 30 minutes. You have abdominal pain. You have a fever. You become sweaty or weak. You are passing large blood clots from the vagina. You start to feel nauseous and vomit. Get help right away if: You have a stiff neck. You drool or cannot swallow liquids. You throw up (vomit) or are not able to keep medicine or liquids down. You have very bad pain that does not go away with medicine. You have problems breathing (not from a stuffy nose).  Please read the additional information packets attached to your discharge summary.  Do not take your medicine if  develop an itchy rash, swelling in your mouth or lips, or difficulty breathing.

## 2018-09-28 NOTE — ED Triage Notes (Signed)
Pt reports she has been on her period x 3 weeks. She has been seen in ED for same. Tonight she had very heavy vaginal bleeding and bled through a pad in 10 minutes

## 2018-09-28 NOTE — ED Notes (Signed)
Heavy Vaginal Bleeding

## 2018-10-05 ENCOUNTER — Inpatient Hospital Stay (HOSPITAL_COMMUNITY)
Admission: AD | Admit: 2018-10-05 | Discharge: 2018-10-05 | Disposition: A | Discharge status: Home / Self Care | Payer: PRIVATE HEALTH INSURANCE | Source: Ambulatory Visit | Attending: Obstetrics & Gynecology | Admitting: Obstetrics & Gynecology

## 2018-10-05 ENCOUNTER — Encounter (HOSPITAL_COMMUNITY): Payer: Self-pay

## 2018-10-05 ENCOUNTER — Other Ambulatory Visit: Payer: Self-pay

## 2018-10-05 DIAGNOSIS — N760 Acute vaginitis: Secondary | ICD-10-CM

## 2018-10-05 DIAGNOSIS — B9689 Other specified bacterial agents as the cause of diseases classified elsewhere: Secondary | ICD-10-CM | POA: Insufficient documentation

## 2018-10-05 DIAGNOSIS — N939 Abnormal uterine and vaginal bleeding, unspecified: Secondary | ICD-10-CM | POA: Insufficient documentation

## 2018-10-05 DIAGNOSIS — Z87891 Personal history of nicotine dependence: Secondary | ICD-10-CM | POA: Insufficient documentation

## 2018-10-05 DIAGNOSIS — N923 Ovulation bleeding: Secondary | ICD-10-CM

## 2018-10-05 DIAGNOSIS — Z3202 Encounter for pregnancy test, result negative: Secondary | ICD-10-CM

## 2018-10-05 HISTORY — DX: Other specified health status: Z78.9

## 2018-10-05 LAB — CBC WITH DIFFERENTIAL/PLATELET
Basophils Absolute: 0 10*3/uL (ref 0.0–0.1)
Basophils Relative: 0 %
EOS PCT: 1 %
Eosinophils Absolute: 0.1 10*3/uL (ref 0.0–0.5)
HEMATOCRIT: 42.6 % (ref 36.0–46.0)
Hemoglobin: 14 g/dL (ref 12.0–15.0)
LYMPHS PCT: 39 %
Lymphs Abs: 3.3 10*3/uL (ref 0.7–4.0)
MCH: 30 pg (ref 26.0–34.0)
MCHC: 32.9 g/dL (ref 30.0–36.0)
MCV: 91.4 fL (ref 80.0–100.0)
MONO ABS: 0.2 10*3/uL (ref 0.1–1.0)
MONOS PCT: 3 %
Neutro Abs: 4.9 10*3/uL (ref 1.7–7.7)
Neutrophils Relative %: 57 %
Platelets: 259 10*3/uL (ref 150–400)
RBC: 4.66 MIL/uL (ref 3.87–5.11)
RDW: 12.6 % (ref 11.5–15.5)
WBC: 8.5 10*3/uL (ref 4.0–10.5)
nRBC: 0 % (ref 0.0–0.2)

## 2018-10-05 LAB — WET PREP, GENITAL
Sperm: NONE SEEN
Trich, Wet Prep: NONE SEEN
Yeast Wet Prep HPF POC: NONE SEEN

## 2018-10-05 LAB — URINALYSIS, ROUTINE W REFLEX MICROSCOPIC
BACTERIA UA: NONE SEEN
BILIRUBIN URINE: NEGATIVE
Glucose, UA: NEGATIVE mg/dL
KETONES UR: NEGATIVE mg/dL
Leukocytes, UA: NEGATIVE
Nitrite: NEGATIVE
Protein, ur: NEGATIVE mg/dL
Specific Gravity, Urine: 1.012 (ref 1.005–1.030)
pH: 5 (ref 5.0–8.0)

## 2018-10-05 LAB — POCT PREGNANCY, URINE: Preg Test, Ur: NEGATIVE

## 2018-10-05 MED ORDER — METRONIDAZOLE 500 MG PO TABS
500.0000 mg | ORAL_TABLET | Freq: Two times a day (BID) | ORAL | 0 refills | Status: DC
Start: 2018-10-05 — End: 2019-01-16

## 2018-10-05 NOTE — MAU Provider Note (Signed)
History     CSN: [id number]  Arrival date and time: 10/05/18 1103   First Provider Initiated Contact with Patient 10/05/18 1444      Chief Complaint  Patient presents with  . Abdominal Pain  . Vaginal Bleeding   HPI  Natalie Gilmore is a 32 y.o. G2P0011 non-pregnant patient who presents to MAU with chief complaint of heavy vaginal bleeding and prolonged bleeding between periods. This is a recurring problem, onset approximately 4 months ago. Patient identifies an LMP of 09/18/18 and states she has been bleeding continuously since that time. She also endorses bilateral lower abdominal cramping which accompanies the onset of her periods. "This pain isn't premenstrual cramping. I don't get that and I never have".   She denies SOB, dizzyness, pelvic pain, history of uterine fibroids or cysts.  [facility] and declined pelvic exam verbalizing she preferred to f/u at MAU. Patient states she was told to follow up in clinic but her insurance does not start until Jan 2020.  Patient states she "feels something is really wrong". She and her partner are planning a pregnancy in 2020 and she is concerned she is infertile.   Patient is a former smoker, now vapes "just a little" throughout the week.   Pertinent Gynecological History: Menses: flow is moderate  Bleeding: intermenstrual bleeding Contraception: none desires pregnancy DES exposure: denies Blood transfusions: none Sexually transmitted diseases: currently at risk Previous GYN Procedures: denies  Last mammogram: N/A age 32 Last pap: normal Date: Remote. Patient states she has not had a pap within the past five years   Past Medical History:  Diagnosis Date  . Medical history non-contributory     Past Surgical History:  Procedure Laterality Date  . APPENDECTOMY    . DILATION AND CURETTAGE OF UTERUS      History reviewed. No pertinent family history.  Social History   Tobacco Use  . Smoking  status: Former Smoker    Packs/day: 0.50    Types: Cigarettes  . Smokeless tobacco: Never Used  Substance Use Topics  . Alcohol use: Yes    Comment: occ  . Drug use: No    Allergies: No Known Allergies  Medications Prior to Admission  Medication Sig Dispense Refill Last Dose  . HYDROcodone-acetaminophen (NORCO/VICODIN) 5-325 MG tablet Take 1-2 tablets by mouth every 6 (six) hours as needed for moderate pain or severe pain. 14 tablet 0 Unknown at Unknown time  . lidocaine (XYLOCAINE) 2 % solution Use as directed 15 mLs in the mouth or throat as needed for mouth pain (Do not swallow). 100 mL 0 Unknown at Unknown time  . Multiple Vitamin (MULTIVITAMIN WITH MINERALS) TABS tablet Take 1 tablet by mouth daily.   Unknown at Unknown time  . omeprazole (PRILOSEC) 20 MG capsule Take 1 capsule (20 mg total) by mouth daily. 30 capsule 0 Unknown at Unknown time  . ondansetron (ZOFRAN ODT) 4 MG disintegrating tablet Take 1 tablet (4 mg total) by mouth every 8 (eight) hours as needed for nausea or vomiting. 10 tablet 0 Unknown at Unknown time    Review of Systems  Constitutional: Negative for fatigue and fever.  Respiratory: Negative for shortness of breath.   Gastrointestinal: Negative for abdominal pain, nausea and vomiting.  Genitourinary: Positive for vaginal bleeding.  Neurological: Negative for headaches.  All other systems reviewed and are negative.  Physical Exam   Blood pressure 127/76, pulse 64, temperature (!) 97.5 F (36.4 C), temperature source Oral,  resp. rate 18, height 5\' 9"  (1.753 m), weight 136 kg, last menstrual period 09/07/2018, SpO2 100 %.  Physical Exam  Nursing note and vitals reviewed. Constitutional: She is oriented to person, place, and time. She appears well-developed and well-nourished.  Cardiovascular: Normal rate.  Respiratory: Effort normal.  GI: Soft. She exhibits no distension. There is no tenderness. There is no rebound and no guarding.  Genitourinary:  Uterus normal. Vaginal discharge found.  Genitourinary Comments: Thin white discharge and scant bleeding on SSE,   Neurological: She is alert and oriented to person, place, and time.  Skin: Skin is warm and dry.  Psychiatric: She has a normal mood and affect. Her behavior is normal. Judgment and thought content normal.    MAU Course  Procedures: sterile speculum exam  MDM --Reviewed nurse note, vitals, and Provider notes from ED visits within past year --Discussed with patient that her CBC is essentially unchanged from assessment last week and 5 months ago --Consider short course of OCP to regular bleeding. Patient declined --Emphasized updated Pap and d/c Vaping will be essential components of preparing her body for pregnancy  Patient Vitals for the past 24 hrs:  BP Temp Temp src Pulse Resp SpO2 Height Weight  10/05/18 1544 127/77 - - 61 20 - - -  10/05/18 1128 127/76 (!) 97.5 F (36.4 C) Oral 64 18 100 % 5\' 9"  (1.753 m) 136 kg    Results for orders placed or performed during the hospital encounter of 10/05/18 (from the past 24 hour(s))  Urinalysis, Routine w reflex microscopic     Status: Abnormal   Collection Time: 10/05/18 12:13 PM  Result Value Ref Range   Color, Urine YELLOW YELLOW   APPearance CLEAR CLEAR   Specific Gravity, Urine 1.012 1.005 - 1.030   pH 5.0 5.0 - 8.0   Glucose, UA NEGATIVE NEGATIVE mg/dL   Hgb urine dipstick LARGE (A) NEGATIVE   Bilirubin Urine NEGATIVE NEGATIVE   Ketones, ur NEGATIVE NEGATIVE mg/dL   Protein, ur NEGATIVE NEGATIVE mg/dL   Nitrite NEGATIVE NEGATIVE   Leukocytes, UA NEGATIVE NEGATIVE   RBC / HPF 0-5 0 - 5 RBC/hpf   WBC, UA 0-5 0 - 5 WBC/hpf   Bacteria, UA NONE SEEN NONE SEEN   Squamous Epithelial / LPF 0-5 0 - 5   Mucus PRESENT   Pregnancy, urine POC     Status: None   Collection Time: 10/05/18  1:43 PM  Result Value Ref Range   Preg Test, Ur NEGATIVE NEGATIVE  CBC with Differential/Platelet     Status: None   Collection Time:  10/05/18  1:54 PM  Result Value Ref Range   WBC 8.5 4.0 - 10.5 K/uL   RBC 4.66 3.87 - 5.11 MIL/uL   Hemoglobin 14.0 12.0 - 15.0 g/dL   HCT 16.1 09.6 - 04.5 %   MCV 91.4 80.0 - 100.0 fL   MCH 30.0 26.0 - 34.0 pg   MCHC 32.9 30.0 - 36.0 g/dL   RDW 40.9 81.1 - 91.4 %   Platelets 259 150 - 400 K/uL   nRBC 0.0 0.0 - 0.2 %   Neutrophils Relative % 57 %   Neutro Abs 4.9 1.7 - 7.7 K/uL   Lymphocytes Relative 39 %   Lymphs Abs 3.3 0.7 - 4.0 K/uL   Monocytes Relative 3 %   Monocytes Absolute 0.2 0.1 - 1.0 K/uL   Eosinophils Relative 1 %   Eosinophils Absolute 0.1 0.0 - 0.5 K/uL   Basophils Relative 0 %  Basophils Absolute 0.0 0.0 - 0.1 K/uL  Wet prep, genital     Status: Abnormal   Collection Time: 10/05/18  2:59 PM  Result Value Ref Range   Yeast Wet Prep HPF POC NONE SEEN NONE SEEN   Trich, Wet Prep NONE SEEN NONE SEEN   Clue Cells Wet Prep HPF POC PRESENT (A) NONE SEEN   WBC, Wet Prep HPF POC FEW (A) NONE SEEN   Sperm NONE SEEN     Meds ordered this encounter  Medications  . metroNIDAZOLE (FLAGYL) 500 MG tablet    Sig: Take 1 tablet (500 mg total) by mouth 2 (two) times daily.    Dispense:  14 tablet    Refill:  0    Order Specific Question:   Supervising Provider    Answer:   Reva Bores [[id number]]   Assessment and Plan  --32 y.o. G2P0011 non-pregnant patient --Bleeding between periods --Hemodynamically stable --Bacterial vaginosis, rx to pharmacy --Discharge home in stable condition. Present to closest ED for new onset SOB, dizziness, activity intolerance  F/U: Patient plans to schedule clinic follow up once she establishes benefits  Calvert Cantor, CNM 10/05/2018, 4:01 PM

## 2018-10-05 NOTE — Discharge Instructions (Signed)

## 2018-10-05 NOTE — MAU Note (Signed)
Reports she has been bleeding heavily for weeks  States she has changed her pad 1 time today, yesterday was changing more often  Having some large blood clots  States she is feeling weak  Abdominal pain started today, cramping, 6/10, intermittent

## 2018-10-06 LAB — GC/CHLAMYDIA PROBE AMP (~~LOC~~) NOT AT ARMC
Chlamydia: NEGATIVE
NEISSERIA GONORRHEA: NEGATIVE

## 2019-01-16 ENCOUNTER — Other Ambulatory Visit: Payer: Self-pay

## 2019-01-16 ENCOUNTER — Inpatient Hospital Stay (HOSPITAL_COMMUNITY)
Admission: AD | Admit: 2019-01-16 | Discharge: 2019-01-16 | Disposition: A | Discharge status: Home / Self Care | Payer: Self-pay | Attending: Obstetrics & Gynecology | Admitting: Obstetrics & Gynecology

## 2019-01-16 DIAGNOSIS — Z789 Other specified health status: Secondary | ICD-10-CM

## 2019-01-16 DIAGNOSIS — N939 Abnormal uterine and vaginal bleeding, unspecified: Secondary | ICD-10-CM

## 2019-01-16 LAB — POCT PREGNANCY, URINE: Preg Test, Ur: NEGATIVE

## 2019-01-16 NOTE — MAU Note (Addendum)
Pt reports vaginal bleeding that started 2 days ago and pain that started last night. Pt reports she was driving daughter from gymnastics tonight and her entire face went numb. Has feeling in her face now. Reports she has had to change her pad 10 times today. Pt reports she has had irregular bleeding for the last few months.

## 2019-01-16 NOTE — MAU Provider Note (Signed)
First Provider Initiated Contact with Patient 01/16/19 2144     S Ms. Natalie Gilmore is a 33 y.o. G69P0011 non-pregnant female who presents to MAU today with complaint of AUB with heavy vaginal bleeding over the past 3-4 days which increased today. She reports hx of heavy vaginal bleeding for months, currently not on birth control has been on Mirena in the past but "fell out during IC".    O BP (!) 146/97 (BP Location: Right Arm)   Pulse 74   Temp 98.4 F (36.9 C) (Oral)   Resp 18   Ht 5\' 9"  (1.753 m)   Wt 135.4 kg   LMP 01/14/2019   SpO2 99%   BMI 44.08 kg/m  Physical Exam  Constitutional: She is oriented to person, place, and time. She appears well-developed and well-nourished. No distress.  Cardiovascular: Normal rate, regular rhythm and normal heart sounds.  Respiratory: Effort normal and breath sounds normal. No respiratory distress. She has no wheezes. She has no rales.  Neurological: She is alert and oriented to person, place, and time.  Psychiatric: She has a normal mood and affect. Her behavior is normal. Thought content normal.   A Non pregnant female Medical screening exam complete 1. Not currently pregnant   2. Abnormal uterine bleeding (AUB)    P Discharge from MAU in stable condition Patient given the option of transfer to [facility] for further evaluation or seek care in outpatient facility of choice List of options for follow-up given  Warning signs for worsening condition that would warrant emergency follow-up discussed Patient may return to MAU as needed for pregnancy related complaints  Sharyon Cable, CNM 01/16/2019 9:53 PM

## 2019-01-16 NOTE — MAU Note (Addendum)
Steward Drone CNM in Triage to talk with pt. Pt then d/c home from Triage

## 2019-01-23 ENCOUNTER — Encounter: Payer: Managed Care, Other (non HMO) | Admitting: Obstetrics & Gynecology

## 2019-05-28 ENCOUNTER — Other Ambulatory Visit: Payer: Self-pay

## 2019-05-28 ENCOUNTER — Encounter: Payer: Self-pay | Admitting: Family Medicine

## 2019-05-28 ENCOUNTER — Ambulatory Visit (INDEPENDENT_AMBULATORY_CARE_PROVIDER_SITE_OTHER): Payer: Managed Care, Other (non HMO) | Admitting: Family Medicine

## 2019-05-28 VITALS — BP 126/92 | HR 82 | Temp 98.2°F | Ht 69.0 in | Wt 308.0 lb

## 2019-05-28 DIAGNOSIS — M722 Plantar fascial fibromatosis: Secondary | ICD-10-CM | POA: Diagnosis not present

## 2019-05-28 DIAGNOSIS — Z23 Encounter for immunization: Secondary | ICD-10-CM | POA: Diagnosis not present

## 2019-05-28 DIAGNOSIS — Z7689 Persons encountering health services in other specified circumstances: Secondary | ICD-10-CM

## 2019-05-28 MED ORDER — NAPROXEN 500 MG PO TABS: 500.0000 mg | ORAL_TABLET | Freq: Two times a day (BID) | ORAL | 0 refills | Status: AC

## 2019-05-28 NOTE — Patient Instructions (Signed)
Ice pack twice per day x 10-15 min Wear supportive shoes and do not walk barefoot on hard surface Exercises daily - see below Take anti-inflammatory 2x/day x 10 days with food Wear night splint - available OTC - x 1-2wks   Plantar Fasciitis  Plantar fasciitis is a painful foot condition that affects the heel. It occurs when the band of tissue that connects the toes to the heel bone (plantar fascia) becomes irritated. This can happen as the result of exercising too much or doing other repetitive activities (overuse injury). The pain from plantar fasciitis can range from mild irritation to severe pain that makes it difficult to walk or move. The pain is usually worse in the morning after sleeping, or after sitting or lying down for a while. Pain may also be worse after long periods of walking or standing. What are the causes? This condition may be caused by:  Standing for long periods of time.  Wearing shoes that do not have good arch support.  Doing activities that put stress on joints (high-impact activities), including running, aerobics, and ballet.  Being overweight.  An abnormal way of walking (gait).  Tight muscles in the back of your lower leg (calf).  High arches in your feet.  Starting a new athletic activity. What are the signs or symptoms? The main symptom of this condition is heel pain. Pain may:  Be worse with first steps after a time of rest, especially in the morning after sleeping or after you have been sitting or lying down for a while.  Be worse after long periods of standing still.  Decrease after 30-45 minutes of activity, such as gentle walking. How is this diagnosed? This condition may be diagnosed based on your medical history and your symptoms. Your health care provider may ask questions about your activity level. Your health care provider will do a physical exam to check for:  A tender area on the bottom of your foot.  A high arch in your foot.  Pain  when you move your foot.  Difficulty moving your foot. You may have imaging tests to confirm the diagnosis, such as:  X-rays.  Ultrasound.  MRI. How is this treated? Treatment for plantar fasciitis depends on how severe your condition is. Treatment may include:  Rest, ice, applying pressure (compression), and raising the affected foot (elevation). This may be called RICE therapy. Your health care provider may recommend RICE therapy along with over-the-counter pain medicines to manage your pain.  Exercises to stretch your calves and your plantar fascia.  A splint that holds your foot in a stretched, upward position while you sleep (night splint).  Physical therapy to relieve symptoms and prevent problems in the future.  Injections of steroid medicine (cortisone) to relieve pain and inflammation.  Stimulating your plantar fascia with electrical impulses (extracorporeal shock wave therapy). This is usually the last treatment option before surgery.  Surgery, if other treatments have not worked after 12 months. Follow these instructions at home:  Managing pain, stiffness, and swelling  If directed, put ice on the painful area: ? Put ice in a plastic bag, or use a frozen bottle of water. ? Place a towel between your skin and the bag or bottle. ? Roll the bottom of your foot over the bag or bottle. ? Do this for 20 minutes, 2-3 times a day.  Wear athletic shoes that have air-sole or gel-sole cushions, or try wearing soft shoe inserts that are designed for plantar fasciitis.  Raise (  elevate) your foot above the level of your heart while you are sitting or lying down. Activity  Avoid activities that cause pain. Ask your health care provider what activities are safe for you.  Do physical therapy exercises and stretches as told by your health care provider.  Try activities and forms of exercise that are easier on your joints (low-impact). Examples include swimming, water aerobics,  and biking. General instructions  Take over-the-counter and prescription medicines only as told by your health care provider.  Wear a night splint while sleeping, if told by your health care provider. Loosen the splint if your toes tingle, become numb, or turn cold and blue.  Maintain a healthy weight, or work with your health care provider to lose weight as needed.  Keep all follow-up visits as told by your health care provider. This is important. Contact a health care provider if you:  Have symptoms that do not go away after caring for yourself at home.  Have pain that gets worse.  Have pain that affects your ability to move or do your daily activities. Summary  Plantar fasciitis is a painful foot condition that affects the heel. It occurs when the band of tissue that connects the toes to the heel bone (plantar fascia) becomes irritated.  The main symptom of this condition is heel pain that may be worse after exercising too much or standing still for a long time.  Treatment varies, but it usually starts with rest, ice, compression, and elevation (RICE therapy) and over-the-counter medicines to manage pain. This information is not intended to replace advice given to you by your health care provider. Make sure you discuss any questions you have with your health care provider. Document Released: 07/17/2001 Document Revised: 10/04/2017 Document Reviewed: 08/19/2017 Elsevier Patient Education  2020 [employer] Inc.   Plantar Fasciitis Rehab Ask your health care provider which exercises are safe for you. Do exercises exactly as told by your health care provider and adjust them as directed. It is normal to feel mild stretching, pulling, tightness, or discomfort as you do these exercises. Stop right away if you feel sudden pain or your pain gets worse. Do not begin these exercises until told by your health care provider. Stretching and range-of-motion exercises These exercises warm up your  muscles and joints and improve the movement and flexibility of your foot. These exercises also help to relieve pain. Plantar fascia stretch  1. Sit with your left / right leg crossed over your opposite knee. 2. Hold your heel with one hand with that thumb near your arch. With your other hand, hold your toes and gently pull them back toward the top of your foot. You should feel a stretch on the bottom of your toes or your foot (plantar fascia) or both. 3. Hold this stretch for__________ seconds. 4. Slowly release your toes and return to the starting position. Repeat __________ times. Complete this exercise __________ times a day. Gastrocnemius stretch, standing This exercise is also called a calf (gastroc) stretch. It stretches the muscles in the back of the upper calf. 1. Stand with your hands against a wall. 2. Extend your left / right leg behind you, and bend your front knee slightly. 3. Keeping your heels on the floor and your back knee straight, shift your weight toward the wall. Do not arch your back. You should feel a gentle stretch in your upper left / right calf. 4. Hold this position for __________ seconds. Repeat __________ times. Complete this exercise __________  times a day. Soleus stretch, standing This exercise is also called a calf (soleus) stretch. It stretches the muscles in the back of the lower calf. 1. Stand with your hands against a wall. 2. Extend your left / right leg behind you, and bend your front knee slightly. 3. Keeping your heels on the floor, bend your back knee and shift your weight slightly over your back leg. You should feel a gentle stretch deep in your lower calf. 4. Hold this position for __________ seconds. Repeat __________ times. Complete this exercise __________ times a day. Gastroc and soleus stretch, standing step This exercise stretches the muscles in the back of the lower leg. These muscles are in the upper calf (gastrocnemius) and the lower calf  (soleus). 1. Stand with the ball of your left / right foot on a step. The ball of your foot is on the walking surface, right under your toes. 2. Keep your other foot firmly on the same step. 3. Hold on to the wall or a railing for balance. 4. Slowly lift your other foot, allowing your body weight to press your left / right heel down over the edge of the step. You should feel a stretch in your left / right calf. 5. Hold this position for __________ seconds. 6. Return both feet to the step. 7. Repeat this exercise with a slight bend in your left / right knee. Repeat __________ times with your left / right knee straight and __________ times with your left / right knee bent. Complete this exercise __________ times a day. Balance exercise This exercise builds your balance and strength control of your arch to help take pressure off your plantar fascia. Single leg stand If this exercise is too easy, you can try it with your eyes closed or while standing on a pillow. 1. Without shoes, stand near a railing or in a doorway. You may hold on to the railing or door frame as needed. 2. Stand on your left / right foot. Keep your big toe down on the floor and try to keep your arch lifted. Do not let your foot roll inward. 3. Hold this position for __________ seconds. Repeat __________ times. Complete this exercise __________ times a day. This information is not intended to replace advice given to you by your health care provider. Make sure you discuss any questions you have with your health care provider. Document Released: 10/22/2005 Document Revised: 02/12/2019 Document Reviewed: 08/20/2018 Elsevier Patient Education  2020 [employer]

## 2019-05-28 NOTE — Progress Notes (Signed)
Natalie Gilmore is a 33 y.o. female  Chief Complaint  Patient presents with  . Establish Care    est care/ shoot pain in both of her heels/ pt states she can barely walk/ when walking around not painful but if she sits for a hwile thats when it happens/3 weeks/ wants tDAP    HPI: Natalie Gilmore is a 33 y.o. female here to establish care with our office. She has not had a PCP in years.  She complains of 3 week h/o B/L heel pain Symptoms worse first thing in AM or if she has been seated for a while. When she gets up she is barely able to walk d/t pain. No numbness or tingling. No swelling. No injury or trauma. No change in activity level or exercise.  Pt has tried ibuprofen 800mg  x 1 dose.   Pt due for Tdap and agrees to this today.  Specialists: OB-GYN (has 06/11/19 appt)  Last CPE, labs: years  Last PAP: overdue  Med refills needed today: none  FamHx: sister with thyroid dysfunction (? Hyperthyroidism)   Past Medical History:  Diagnosis Date  . Medical history non-contributory     Past Surgical History:  Procedure Laterality Date  . APPENDECTOMY    . DILATION AND CURETTAGE OF UTERUS      Social History   Socioeconomic History  . Marital status: Married    Spouse name: Not on file  . Number of children: Not on file  . Years of education: Not on file  . Highest education level: Not on file  Occupational History  . Not on file  Social Needs  . Financial resource strain: Not on file  . Food insecurity    Worry: Not on file    Inability: Not on file  . Transportation needs    Medical: Not on file    Non-medical: Not on file  Tobacco Use  . Smoking status: Former Smoker    Packs/day: 0.50    Types: Cigarettes  . Smokeless tobacco: Never Used  Substance and Sexual Activity  . Alcohol use: Yes    Comment: occ  . Drug use: No  . Sexual activity: Yes    Birth control/protection: None  Lifestyle  . Physical activity    Days per week: Not on file    Minutes  per session: Not on file  . Stress: Not on file  Relationships  . Social Musicianconnections    Talks on phone: Not on file    Gets together: Not on file    Attends religious service: Not on file    Active member of club or organization: Not on file    Attends meetings of clubs or organizations: Not on file    Relationship status: Not on file  . Intimate partner violence    Fear of current or ex partner: Not on file    Emotionally abused: Not on file    Physically abused: Not on file    Forced sexual activity: Not on file  Other Topics Concern  . Not on file  Social History Narrative  . Not on file    Family History  Problem Relation Age of Onset  . Diabetes Father   . Diabetes Maternal Grandmother   . Diabetes Maternal Grandfather   . Diabetes Paternal Grandmother   . Diabetes Paternal Grandfather      There is no immunization history for the selected administration types on file for this patient.  No outpatient encounter medications on  file as of 05/28/2019.   No facility-administered encounter medications on file as of 05/28/2019.      ROS: Pertinent positives and negatives noted in HPI. Remainder of ROS non-contributory  No Known Allergies  BP (!) 126/92   Pulse 82   Temp 98.2 F (36.8 C) (Oral)   Ht 5\' 9"  (1.753 m)   Wt (!) 308 lb (139.7 kg)   SpO2 96%   BMI 45.48 kg/m   BP Readings from Last 3 Encounters:  05/28/19 (!) 126/92  01/16/19 (!) 146/97  10/05/18 127/77   Wt Readings from Last 3 Encounters:  05/28/19 (!) 308 lb (139.7 kg)  01/16/19 298 lb 8 oz (135.4 kg)  10/05/18 299 lb 12 oz (136 kg)    Physical Exam  Constitutional: She is oriented to person, place, and time.  Musculoskeletal: Normal range of motion.        General: Tenderness (B/L heel TTP) present. No edema.  Neurological: She is alert and oriented to person, place, and time. She has normal reflexes. She exhibits normal muscle tone. Coordination normal.  Skin: Skin is warm and dry.   Psychiatric: She has a normal mood and affect. Her behavior is normal.     A/P:  1. Encounter to establish care with new doctor - due for CPE, PAP, fasting labs - pt will schedule  2. Plantar fasciitis, bilateral - ice BID - exercises daily - included in AVS - stop wearing flats and wear supportive shoes, avoid walking barefoot on hard surface Rx: - naproxen (NAPROSYN) 500 MG tablet; Take 1 tablet (500 mg total) by mouth 2 (two) times daily with a meal.  Dispense: 30 tablet; Refill: 0 - pt to take BID x 10 days with food - night splint x 1-2 wks - f/u PRN  3. Need for Tdap vaccination - Tdap vaccine greater than or equal to 7yo IM

## 2019-06-19 ENCOUNTER — Encounter: Payer: Managed Care, Other (non HMO) | Admitting: Family Medicine

## 2020-03-15 ENCOUNTER — Encounter (HOSPITAL_BASED_OUTPATIENT_CLINIC_OR_DEPARTMENT_OTHER): Payer: Self-pay | Admitting: Emergency Medicine

## 2020-03-15 ENCOUNTER — Other Ambulatory Visit: Payer: Self-pay

## 2020-03-15 ENCOUNTER — Emergency Department (HOSPITAL_BASED_OUTPATIENT_CLINIC_OR_DEPARTMENT_OTHER)
Admission: EM | Admit: 2020-03-15 | Discharge: 2020-03-15 | Disposition: A | Discharge status: Home / Self Care | Payer: Managed Care, Other (non HMO) | Attending: Emergency Medicine | Admitting: Emergency Medicine

## 2020-03-15 DIAGNOSIS — N939 Abnormal uterine and vaginal bleeding, unspecified: Secondary | ICD-10-CM | POA: Insufficient documentation

## 2020-03-15 DIAGNOSIS — Z87891 Personal history of nicotine dependence: Secondary | ICD-10-CM | POA: Insufficient documentation

## 2020-03-15 DIAGNOSIS — R110 Nausea: Secondary | ICD-10-CM | POA: Insufficient documentation

## 2020-03-15 DIAGNOSIS — E860 Dehydration: Secondary | ICD-10-CM

## 2020-03-15 DIAGNOSIS — B9689 Other specified bacterial agents as the cause of diseases classified elsewhere: Secondary | ICD-10-CM

## 2020-03-15 DIAGNOSIS — N760 Acute vaginitis: Secondary | ICD-10-CM | POA: Insufficient documentation

## 2020-03-15 LAB — CBC WITH DIFFERENTIAL/PLATELET
Abs Immature Granulocytes: 0.04 10*3/uL (ref 0.00–0.07)
Basophils Absolute: 0 10*3/uL (ref 0.0–0.1)
Basophils Relative: 0 %
Eosinophils Absolute: 0.1 10*3/uL (ref 0.0–0.5)
Eosinophils Relative: 1 %
HCT: 40.8 % (ref 36.0–46.0)
Hemoglobin: 13.9 g/dL (ref 12.0–15.0)
Immature Granulocytes: 1 %
Lymphocytes Relative: 30 %
Lymphs Abs: 2.4 10*3/uL (ref 0.7–4.0)
MCH: 30.5 pg (ref 26.0–34.0)
MCHC: 34.1 g/dL (ref 30.0–36.0)
MCV: 89.7 fL (ref 80.0–100.0)
Monocytes Absolute: 0.5 10*3/uL (ref 0.1–1.0)
Monocytes Relative: 7 %
Neutro Abs: 5 10*3/uL (ref 1.7–7.7)
Neutrophils Relative %: 61 %
Platelets: 211 10*3/uL (ref 150–400)
RBC: 4.55 MIL/uL (ref 3.87–5.11)
RDW: 12.7 % (ref 11.5–15.5)
WBC: 8.1 10*3/uL (ref 4.0–10.5)
nRBC: 0 % (ref 0.0–0.2)

## 2020-03-15 LAB — COMPREHENSIVE METABOLIC PANEL
ALT: 40 U/L (ref 0–44)
AST: 29 U/L (ref 15–41)
Albumin: 3.7 g/dL (ref 3.5–5.0)
Alkaline Phosphatase: 70 U/L (ref 38–126)
Anion gap: 10 (ref 5–15)
BUN: 14 mg/dL (ref 6–20)
CO2: 23 mmol/L (ref 22–32)
Calcium: 8.6 mg/dL — ABNORMAL LOW (ref 8.9–10.3)
Chloride: 105 mmol/L (ref 98–111)
Creatinine, Ser: 0.67 mg/dL (ref 0.44–1.00)
GFR calc Af Amer: >60 mL/min (ref >60)
GFR calc non Af Amer: >60 mL/min (ref >60)
Glucose, Bld: 90 mg/dL (ref 70–99)
Potassium: 3.9 mmol/L (ref 3.5–5.1)
Sodium: 138 mmol/L (ref 135–145)
Total Bilirubin: 0.6 mg/dL (ref 0.3–1.2)
Total Protein: 7.2 g/dL (ref 6.5–8.1)

## 2020-03-15 LAB — URINALYSIS, ROUTINE W REFLEX MICROSCOPIC
Bilirubin Urine: NEGATIVE
Glucose, UA: NEGATIVE mg/dL
Ketones, ur: NEGATIVE mg/dL
Leukocytes,Ua: NEGATIVE
Nitrite: NEGATIVE
Protein, ur: NEGATIVE mg/dL
Specific Gravity, Urine: 1.025 (ref 1.005–1.030)
pH: 6 (ref 5.0–8.0)

## 2020-03-15 LAB — URINALYSIS, MICROSCOPIC (REFLEX)
RBC / HPF: >50 RBC/hpf (ref 0–5)
WBC, UA: NONE SEEN WBC/hpf (ref 0–5)

## 2020-03-15 LAB — TSH: TSH: 1.381 u[IU]/mL (ref 0.350–4.500)

## 2020-03-15 LAB — WET PREP, GENITAL
Sperm: NONE SEEN
Trich, Wet Prep: NONE SEEN
WBC, Wet Prep HPF POC: NONE SEEN
Yeast Wet Prep HPF POC: NONE SEEN

## 2020-03-15 LAB — PREGNANCY, URINE: Preg Test, Ur: NEGATIVE

## 2020-03-15 LAB — LIPASE, BLOOD: Lipase: 33 U/L (ref 11–51)

## 2020-03-15 LAB — HIV ANTIBODY (ROUTINE TESTING W REFLEX): HIV Screen 4th Generation wRfx: NONREACTIVE

## 2020-03-15 MED ORDER — SODIUM CHLORIDE 0.9 % IV BOLUS
1000.0000 mL | Freq: Once | INTRAVENOUS | Status: AC
  Administered 2020-03-15: 1000 mL via INTRAVENOUS

## 2020-03-15 MED ORDER — METRONIDAZOLE 500 MG PO TABS: 500.0000 mg | ORAL_TABLET | Freq: Two times a day (BID) | ORAL | 0 refills | Status: AC

## 2020-03-15 NOTE — ED Notes (Signed)
Orthostatic vitals unable to complete. Biomedical in patient's room working on monitor.

## 2020-03-15 NOTE — ED Triage Notes (Signed)
Vaginal bleeding for over 1 year, states continuous. States feels weak today and LLQ pain. Alert, amb NAD. Denies pregnancy.

## 2020-03-15 NOTE — Discharge Instructions (Signed)
Your labwork was reassuring today. You did test positive for bacterial vaginosis - please pick up antibiotic and take as prescribed. DO NOT DRINK ALCOHOL WHILE ON THIS MEDICATION.   We have tested you for gonorrhea, chlamydia, HIV, and syphilis as well. We will call if you test positive. Refrain from intercourse until you receive test results. If positive please return to the ED for treatment. You will need to let all partners know that they will need to be tested and treated at that time.   Please keep appointment with OBGYN as scheduled for tomorrow to discuss your prolonged vaginal bleeding.   Return to the ED for any worsening symptoms including worsening bleeding, passing out, blurry vision/double vision, chest pain, shortness of breath, or any other concerning symptoms

## 2020-03-15 NOTE — ED Notes (Signed)
Patient denies pain and is resting comfortably.  

## 2020-03-15 NOTE — ED Provider Notes (Signed)
[facility] EMERGENCY DEPARTMENT Provider Note   CSN: [id number] Arrival date & time: 03/15/20  1530     History Chief Complaint  Patient presents with  . Vaginal Bleeding    Natalie Gilmore is a 34 y.o. female who presents to the ED today with complaint of persistent vaginal bleeding x 1-2 years.  Patient reports she has had vaginal bleeding every single day since 1 to 2 years ago.  She states that she typically need to use 2-3 pads stacked on top of each other to control the bleeding.  She states she goes through 12 per day.  Patient states that today while at work she bled through her pants and she felt lightheaded prompting her to come to the ED today.  Is also complaining of some left-sided abdominal pain that is new for her.  She has some nausea but no vomiting.  She states that she has been seen by multiple providers including an OB/GYN as well as multiple providers and several ED's and "no one has been able to help me."  She states that she saw an OB/GYN approximately 5 to 6 months ago and was placed on 10-day course of OCPs.  She states she did not bleed while on the OCPs however when she finished she continued to have bleeding.  She has not followed back up with them.  States she has not been on prolonged birth control whatsoever.  States he had an IUD placed approximately 9 years ago after giving birth to her child however states that it fell out 2 weeks later.  Nuys fevers, chills, chest pain, shortness of breath, pelvic pain, vaginal discharge, vomiting, diarrhea, urinary symptoms, any other associated symptoms.   The history is provided by the patient and medical records.   Per chart review:  OBGYN visit on 08/13/2019 after having mutliple + UPT at home x 2 weeks. Pt had a negative urine preg test in the office as well as neg beta hcg. No mention of prolonged bleeding.     Past Medical History:  Diagnosis Date  . Medical history non-contributory     There are no  problems to display for this patient.   Past Surgical History:  Procedure Laterality Date  . APPENDECTOMY    . DILATION AND CURETTAGE OF UTERUS       OB History    Gravida  2   Para  0   Term  0   Preterm      AB  1   Living  1     SAB  1   TAB      Ectopic      Multiple      Live Births  1           Family History  Problem Relation Age of Onset  . Diabetes Father   . Diabetes Maternal Grandmother   . Diabetes Maternal Grandfather   . Diabetes Paternal Grandmother   . Diabetes Paternal Grandfather     Social History   Tobacco Use  . Smoking status: Former Smoker    Packs/day: 0.50    Types: Cigarettes  . Smokeless tobacco: Never Used  Substance Use Topics  . Alcohol use: Yes    Comment: occ  . Drug use: No    Home Medications Prior to Admission medications   Medication Sig Start Date End Date Taking? Authorizing Provider  metroNIDAZOLE (FLAGYL) 500 MG tablet Take 1 tablet (500 mg total) by mouth 2 (two) times  daily. 03/15/20   Tanda Rockers, PA-C  naproxen (NAPROSYN) 500 MG tablet Take 1 tablet (500 mg total) by mouth 2 (two) times daily with a meal. 05/28/19   Cirigliano, Jearld Lesch, DO    Allergies    Patient has no known allergies.  Review of Systems   Review of Systems  Constitutional: Negative for chills and fever.  Gastrointestinal: Positive for abdominal pain and nausea. Negative for constipation, diarrhea and vomiting.  Genitourinary: Positive for menstrual problem and vaginal bleeding.  Neurological: Positive for light-headedness. Negative for syncope.  All other systems reviewed and are negative.   Physical Exam Updated Vital Signs BP (!) 150/90 (BP Location: Right Arm)   Pulse 65   Temp 98.2 F (36.8 C) (Oral)   Resp 20   Ht 5\' 9"  (1.753 m)   Wt (!) 141.2 kg   SpO2 99%   BMI 45.97 kg/m   Physical Exam Vitals and nursing note reviewed.  Constitutional:      Appearance: She is not ill-appearing or diaphoretic.  HENT:      Head: Normocephalic and atraumatic.  Eyes:     Conjunctiva/sclera: Conjunctivae normal.  Cardiovascular:     Rate and Rhythm: Normal rate and regular rhythm.     Pulses: Normal pulses.  Pulmonary:     Effort: Pulmonary effort is normal.     Breath sounds: Normal breath sounds. No wheezing, rhonchi or rales.  Abdominal:     Palpations: Abdomen is soft.     Tenderness: There is abdominal tenderness. There is no guarding or rebound.     Comments: Soft, very minimal LUQ TTP, +BS throughout, no r/g/r, neg murphy's, neg mcburney's, no CVA TTP  Genitourinary:    Comments: Chaperone present for exam. No rashes, lesions, or tenderness to external genitalia. No erythema, injury, or tenderness to vaginal mucosa.Small amount of blood in vault. No adnexal masses, tenderness, or fullness. No CMT, cervical friability, or discharge from cervical os. Cervical os is closed. Uterus non-deviated, mobile, nonTTP, and without enlargement.  Musculoskeletal:     Cervical back: Neck supple.  Skin:    General: Skin is warm and dry.  Neurological:     Mental Status: She is alert.     ED Results / Procedures / Treatments   Labs (all labs ordered are listed, but only abnormal results are displayed) Labs Reviewed  WET PREP, GENITAL - Abnormal; Notable for the following components:      Result Value   Clue Cells Wet Prep HPF POC PRESENT (*)    All other components within normal limits  URINALYSIS, ROUTINE W REFLEX MICROSCOPIC - Abnormal; Notable for the following components:   APPearance CLOUDY (*)    Hgb urine dipstick LARGE (*)    All other components within normal limits  URINALYSIS, MICROSCOPIC (REFLEX) - Abnormal; Notable for the following components:   Bacteria, UA RARE (*)    All other components within normal limits  COMPREHENSIVE METABOLIC PANEL - Abnormal; Notable for the following components:   Calcium 8.6 (*)    All other components within normal limits  PREGNANCY, URINE  LIPASE, BLOOD    CBC WITH DIFFERENTIAL/PLATELET  TSH  HIV ANTIBODY (ROUTINE TESTING W REFLEX)  RPR  GC/CHLAMYDIA PROBE AMP ([facility]) NOT AT [facility]    EKG None  Radiology No results found.  Procedures Procedures (including critical care time)  Medications Ordered in ED Medications  sodium chloride 0.9 % bolus 1,000 mL (1,000 mLs Intravenous New Bag/Given 03/15/20 2015)  ED Course  I have reviewed the triage vital signs and the nursing notes.  Pertinent labs & imaging results that were available during my care of the patient were reviewed by me and considered in my medical decision making (see chart for details).  Clinical Course as of Mar 16 2119  Tue Mar 15, 2020  1811 Clue Cells Wet Prep HPF POC(!): PRESENT [MV]  1811 Hemoglobin: 13.9 [MV]    Clinical Course User Index [MV] Tanda Rockers, PA-C   MDM Rules/Calculators/A&P                      34 year old female presents the ED today complaining of persistent vaginal bleeding x1 to 2 years.  Has been evaluated by OB/GYN however reports no one has been able to find the cause.  Reports she has never had an ultrasound done or been placed on prolonged birth control to control bleeding.  On arrival to the ED patient is afebrile, nontachycardic and nontachypneic.  She does report feeling of dizziness/lightheadedness earlier today which concerned her prompting her to come to the ED.  Also complains of some mild left upper quadrant abdominal pain.  Work-up for abdominal pain at this time.  Will perform pelvic exam given persistent bleeding.  Will check hemoglobin level.  Will check orthostatics given complaint of lightheadedness today.  Will need to follow-up with OB/GYN regardless given her prolonged symptoms.  She does report she has follow-up visit with a new provider tomorrow.  Have advised to keep.  Exam performed with chaperone at bedside.  Small amount of blood in vault however no adnexal or CMT tenderness.  No obvious discharge  appreciated.   Wet prep has returned positive for clue cells.  Will treat for BV.  CBC with stable hemoglobin at 13.9.  CMP without electrolyte abnormalities. Lipase 33. U/A with large hgb and > 50 RBCs consistent with bleeding.   Orthostatics obtained which were positive for hypotension. Will treat with fluids. Suspect this is cause of pt's lightheadedness today.   Pt received fluids and reports improvement in sx. Will discharge home at this time. Pt advised to keep appt with OBGYN for tomorrow. Strict return precautions have been discussed. She is in agreement with plan and stable for discharge home.   This note was prepared using Dragon voice recognition software and may include unintentional dictation errors due to the inherent limitations of voice recognition software. Final Clinical Impression(s) / ED Diagnoses Final diagnoses:  Abnormal uterine bleeding (AUB)  Dehydration  Bacterial vaginosis    Rx / DC Orders ED Discharge Orders         Ordered    metroNIDAZOLE (FLAGYL) 500 MG tablet  2 times daily     03/15/20 2024           Discharge Instructions     Your labwork was reassuring today. You did test positive for bacterial vaginosis - please pick up antibiotic and take as prescribed. DO NOT DRINK ALCOHOL WHILE ON THIS MEDICATION.   We have tested you for gonorrhea, chlamydia, HIV, and syphilis as well. We will call if you test positive. Refrain from intercourse until you receive test results. If positive please return to the ED for treatment. You will need to let all partners know that they will need to be tested and treated at that time.   Please keep appointment with OBGYN as scheduled for tomorrow to discuss your prolonged vaginal bleeding.   Return to the ED for any worsening  symptoms including worsening bleeding, passing out, blurry vision/double vision, chest pain, shortness of breath, or any other concerning symptoms       Eustaquio Maize, PA-C 03/15/20  2120    Drenda Freeze, MD 03/18/20 1534

## 2020-03-16 LAB — GC/CHLAMYDIA PROBE AMP (~~LOC~~) NOT AT ARMC
Chlamydia: NEGATIVE
Comment: NEGATIVE
Comment: NORMAL
Neisseria Gonorrhea: NEGATIVE

## 2020-03-16 LAB — RPR: RPR Ser Ql: NONREACTIVE
